# Patient Record
Sex: Female | Born: 1960 | Race: White | Hispanic: No | Marital: Married | Smoking: Current every day smoker
Health system: Southern US, Community
[De-identification: ages and names within clinical notes are randomized; demographics above are authoritative.]

## PROBLEM LIST (undated history)

## (undated) DIAGNOSIS — F32A Depression, unspecified: Secondary | ICD-10-CM

## (undated) DIAGNOSIS — F419 Anxiety disorder, unspecified: Secondary | ICD-10-CM

## (undated) HISTORY — DX: Depression, unspecified: F32.A

---

## 2002-03-21 ENCOUNTER — Encounter: Payer: Self-pay | Admitting: Family Medicine

## 2002-03-21 ENCOUNTER — Ambulatory Visit (HOSPITAL_COMMUNITY): Admission: RE | Admit: 2002-03-21 | Discharge: 2002-03-21 | Payer: Self-pay | Admitting: Nurse Practitioner

## 2002-04-06 ENCOUNTER — Encounter: Payer: Self-pay | Admitting: Family Medicine

## 2002-04-06 ENCOUNTER — Ambulatory Visit (HOSPITAL_COMMUNITY): Admission: RE | Admit: 2002-04-06 | Discharge: 2002-04-06 | Payer: Self-pay | Admitting: Family Medicine

## 2003-07-25 ENCOUNTER — Ambulatory Visit (HOSPITAL_COMMUNITY): Admission: RE | Admit: 2003-07-25 | Discharge: 2003-07-25 | Payer: Self-pay | Admitting: Pulmonary Disease

## 2004-08-07 ENCOUNTER — Ambulatory Visit (HOSPITAL_COMMUNITY): Admission: RE | Admit: 2004-08-07 | Discharge: 2004-08-07 | Payer: Self-pay | Admitting: Pulmonary Disease

## 2005-08-11 ENCOUNTER — Ambulatory Visit (HOSPITAL_COMMUNITY): Admission: RE | Admit: 2005-08-11 | Discharge: 2005-08-11 | Payer: Self-pay | Admitting: Pulmonary Disease

## 2006-10-18 ENCOUNTER — Ambulatory Visit (HOSPITAL_COMMUNITY): Admission: RE | Admit: 2006-10-18 | Discharge: 2006-10-18 | Payer: Self-pay | Admitting: Obstetrics and Gynecology

## 2007-01-31 ENCOUNTER — Ambulatory Visit: Payer: Self-pay | Admitting: Gastroenterology

## 2007-02-18 ENCOUNTER — Ambulatory Visit (HOSPITAL_COMMUNITY): Admission: RE | Admit: 2007-02-18 | Discharge: 2007-02-18 | Payer: Self-pay | Admitting: Family Medicine

## 2007-05-10 ENCOUNTER — Ambulatory Visit: Payer: Self-pay | Admitting: Internal Medicine

## 2007-05-23 ENCOUNTER — Ambulatory Visit (HOSPITAL_COMMUNITY): Admission: RE | Admit: 2007-05-23 | Discharge: 2007-05-23 | Payer: Self-pay | Admitting: Internal Medicine

## 2007-05-23 ENCOUNTER — Ambulatory Visit: Payer: Self-pay | Admitting: Internal Medicine

## 2007-08-22 ENCOUNTER — Ambulatory Visit (HOSPITAL_COMMUNITY): Admission: RE | Admit: 2007-08-22 | Discharge: 2007-08-22 | Payer: Self-pay | Admitting: Internal Medicine

## 2007-08-25 ENCOUNTER — Ambulatory Visit: Payer: Self-pay | Admitting: Internal Medicine

## 2007-10-20 ENCOUNTER — Ambulatory Visit (HOSPITAL_COMMUNITY): Admission: RE | Admit: 2007-10-20 | Discharge: 2007-10-20 | Payer: Self-pay | Admitting: Obstetrics and Gynecology

## 2008-02-20 ENCOUNTER — Ambulatory Visit (HOSPITAL_COMMUNITY): Admission: RE | Admit: 2008-02-20 | Discharge: 2008-02-20 | Payer: Self-pay | Admitting: Internal Medicine

## 2008-09-01 ENCOUNTER — Emergency Department (HOSPITAL_COMMUNITY): Admission: EM | Admit: 2008-09-01 | Discharge: 2008-09-01 | Payer: Self-pay | Admitting: Emergency Medicine

## 2008-09-05 ENCOUNTER — Ambulatory Visit (HOSPITAL_COMMUNITY): Admission: RE | Admit: 2008-09-05 | Discharge: 2008-09-05 | Payer: Self-pay | Admitting: Internal Medicine

## 2008-12-31 ENCOUNTER — Ambulatory Visit (HOSPITAL_COMMUNITY): Admission: RE | Admit: 2008-12-31 | Discharge: 2008-12-31 | Payer: Self-pay | Admitting: Obstetrics and Gynecology

## 2009-01-03 ENCOUNTER — Ambulatory Visit (HOSPITAL_COMMUNITY): Admission: RE | Admit: 2009-01-03 | Discharge: 2009-01-03 | Payer: Self-pay | Admitting: Obstetrics and Gynecology

## 2009-01-20 ENCOUNTER — Emergency Department (HOSPITAL_COMMUNITY): Admission: EM | Admit: 2009-01-20 | Discharge: 2009-01-20 | Payer: Self-pay | Admitting: Emergency Medicine

## 2009-08-23 ENCOUNTER — Other Ambulatory Visit: Admission: RE | Admit: 2009-08-23 | Discharge: 2009-08-23 | Payer: Self-pay | Admitting: Obstetrics & Gynecology

## 2010-01-14 ENCOUNTER — Ambulatory Visit (HOSPITAL_COMMUNITY): Admission: RE | Admit: 2010-01-14 | Discharge: 2010-01-14 | Payer: Self-pay | Admitting: Obstetrics and Gynecology

## 2010-08-26 ENCOUNTER — Other Ambulatory Visit: Admission: RE | Admit: 2010-08-26 | Discharge: 2010-08-26 | Payer: Self-pay | Admitting: Obstetrics & Gynecology

## 2010-12-28 ENCOUNTER — Encounter: Payer: Self-pay | Admitting: Obstetrics and Gynecology

## 2010-12-28 ENCOUNTER — Encounter: Payer: Self-pay | Admitting: Family Medicine

## 2010-12-29 ENCOUNTER — Encounter: Payer: Self-pay | Admitting: Internal Medicine

## 2011-04-21 NOTE — Assessment & Plan Note (Signed)
Vickie [name], Vickie [name]               CHART#:  [id number]   DATE:  08/25/2007                       DOB:  Sep 10, 1961   SUBJECTIVE:  The patient is a 50 year old Caucasian female with  refractory GERD. She had previously been on omeprazole, was recently  changed to Prevacid 30 mg daily. She has been doing much better on this  medication. She had an EGD by Dr. Jena Gauss on 05/22/07. She was found to  have a normal exam.  She also has had an MRI of the liver on 03/01/07.  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 She had normal LFTs. She had a followup MRI on 08/22/07. The  lesion measured 1.4 x 1.3 x 1.2 cm and was stable. Again felt to be FNH  but 32-month followup was recommended. Overall, the patient is doing very  well. Her weight is steadily decreasing.   CURRENT MEDICATIONS:  See the list from 08/25/07.   ALLERGIES:  1. PENICILLIN.  2. CODEINE.   OBJECTIVE:  VITAL SIGNS: Weight 168 pounds, height 62.5 inches, temp 94.  Blood pressure 100/70 and pulse 60.  GENERAL: The patient is a well-developed, well-nourished Caucasian  female in no acute distress.  HEENT: Sclerae clear, anicteric.  Oropharynx pink and moist without any  lesions.  CHEST: Regular rate and rhythm, normal S1, S2.  ABDOMEN: Positive bowel sounds x4. No bruits auscultated. Soft,  nontender, nondistended, without palpable mass or hepatosplenomegaly. No  tenderness or guarding.  EXTREMITIES: Without clubbing or edema bilaterally.  SKIN: Warm, dry without rash or jaundice.   ASSESSMENT:  1. The patient is a 50 year old female with gastroesophageal reflux      disease, symptoms well controlled on Prevacid 30 mg daily.  2. She also hepatic focal nodular hyperplasia. Will need 71-month      followup to confirm stability.   PLAN:  1. Prevacid 30 mg daily #31 with 11 refills. We will attempt to give      her patient assistance forms for this.  2. MRI of the liver 02/19/08.  3.  Office visit a couple of days after MRI to discuss findings.       Lorenza Burton, N.P.  Electronically Signed     R. Roetta Sessions, M.D.  Electronically Signed    KJ/MEDQ  D:  08/25/2007  T:  08/25/2007  Job:  [id number]   cc:   Corrie Mckusick, M.D.

## 2011-04-21 NOTE — H&P (Signed)
NAME:  Vickie [name], Vickie [name]              ACCOUNT NO.:  [id number]   MEDICAL RECORD NO.:  [id number]          PATIENT TYPE:  AMB   LOCATION:  DAY                           FACILITY:  [facility]   PHYSICIAN:  R. Roetta Sessions, M.D. DATE OF BIRTH:  [birth year]   DATE OF ADMISSION:  DATE OF DISCHARGE:  LH                              HISTORY & PHYSICAL   CHIEF COMPLAINT:  Refractory reflux symptoms.   The patient is a pleasant obese 50 year old Caucasian female originally  seen by our group back in February by Dr. Cira Servant for progressive reflux  symptoms in the setting of a 30-pound weight gain. She had stopped  smoking earlier this year. She had been on Prilosec 20 mg twice daily  and more recently Prevacid 30 mg once daily, samples with significant  better control with Prevacid although she sometimes has almost daily  symptoms has taken a number of over-the-counter agents. No odynophagia,  and no dysphagia.  She is a former long-term smoker. She does not use  any alcohol.   Interestingly, a crown came loose in her mouth and she swallowed it back  in March.  This led to radiographic examination orchestrated by Dr.  Phillips Odor. She ultimately ended up getting a CT and MRI. CT reportedly  demonstrated gallstones and nonspecific lesion in her liver.  She also  had some ovarian cysts.  This led to an open MRI scan down in [place]  which revealed a small central right hepatic lesion approximately 15 mm  size. It was nonspecific and could not be called a simple hemangioma.  It was recommended 3-6 month interim follow-up CT.  I do not have any  information regarding the patient's LFTs.   She did take birth control pills for 5 years several years ago. She has  not had any abdominal pain.  She has one bowel movement day.  No melena,  rectal pain, constipation, diarrhea.   PAST MEDICAL HISTORY:  Significant for gastroesophageal reflux disease.   CURRENT MEDICATIONS:  Either Prilosec OTC b.i.d. or Prevacid  30 grams  orally daily.   ALLERGIES:  PENICILLIN, CODEINE.   She has never had an EGD.   FAMILY HISTORY:  Mother and father both had gallbladders removed.  No  history of gastrointestinal cancer.   SOCIAL HISTORY:  The patient has two children, one 20 and one 70.  She  lives with her significant other. She was a former Neurosurgeon. No  illicit drug use.  No alcohol use.  Former smoker.   REVIEW OF SYSTEMS:  No chest pain, dyspnea on exertion.  No fever,  chills.  She is gained 30 pounds in the past year.   PHYSICAL EXAMINATION:  GENERAL:  A pleasant 50 year old lady resting  comfortably.  VITAL SIGNS:  Weight 172, height 5 feet 4 inches, temperature 97.8, BP  120/80, pulse 60.  SKIN:  Warm and dry.  No jaundice or a stigmata of chronic liver  disease.  HEENT:  No scleral icterus.  Conjunctivae are pink.  Oral cavity no  lesions.  JVD is not prominent.  CHEST:  Lungs clear to auscultation.  HEART:  Regular rate and rhythm without murmur, gallop or rub.  BREAST:  Deferred.  ABDOMEN:  Obese, positive bowel sounds, soft, nontender without  appreciable mass or organomegaly.  EXTREMITIES:  Trace lower extremity edema.   IMPRESSION:  The patient is a pleasant 46-year lady with significant  weight gain who now has somewhat refractory gastroesophageal reflux  disease symptoms.  I suspect weight gain is predisposing her reflux.  She is really poorly controlled on standard dose PPI therapy.  This  warrants a look at her upper GI tract directly via EGD. I have  recommended this procedure. The potential risks, benefits and  alternatives have been reviewed.  Questions were answered.  She is  agreeable.  We will plan for an EGD in the very near future at [facility]  We will give her usual samples of Prevacid 30 grams  orally daily x30 days.   She has a nonspecific a central lesion on MRI the right lobe of liver on  the MRI  which was recently seen on CT. As stated, this appears  to be  entirely nonspecific lesion and I would agree with the radiologist that  interim follow-up MRI was warranted.  We will plan to get this study  repeated the latter part of July, we will check a hepatic profile make  sure everything looks good.   Reported gallstones seen on CT asymptomatic.   Further recommendations to follow in the very near future.      Jonathon Bellows, M.D.  Electronically Signed     RMR/MEDQ  D:  05/10/2007  T:  05/10/2007  Job:  [id number]   cc:   Corrie Mckusick, M.D.  Fax: [phone]

## 2011-04-21 NOTE — Op Note (Signed)
NAME:  [name], Vickie              ACCOUNT NO.:  [id number]   MEDICAL RECORD NO.:  [id number]          PATIENT TYPE:  AMB   LOCATION:  DAY                           FACILITY:  [facility]   PHYSICIAN:  R. Roetta Sessions, M.D. DATE OF BIRTH:  December 18, 1960   DATE OF PROCEDURE:  05/23/2007  DATE OF DISCHARGE:                                PROCEDURE NOTE   INDICATIONS FOR PROCEDURE:  A 50 year old lady with significant weight  gain, somewhat refractory gastroesophageal reflux disease symptoms.  She  does much better with Prevacid than omeprazole, which she has been  taking more recently.  Has not had any GI bleeding, odynophagia,  dysphagia, early satiety or weight loss.  EGD is now being done.  This  approach has been discussed with the patient in length.  Potential  risks, benefits and alternatives have been reviewed, questions answered,  she is agreeable.  __________.   O2 saturation, blood pressure, pulse, respirations were monitored  throughout the entire procedure.   CONSCIOUS SEDATION:  Versed 4 mg IV, Demerol 75 mg IV in divided doses.  Cetacaine spray for topical oropharyngeal anesthesia.   FINDINGS:  Examination of the tubular esophagus revealed no mucosal  abnormalities.  __________  gastric cavity was empty, insufflated well  with air.  A thorough examination of gastric mucosa with retroflexion of  the proximal stomach and esophagogastric junction demonstrated no  abnormalities.  Pylorus patent, easily traversed.  Summation of the  bulbous second portion revealed no abnormalities.   THERAPEUTIC/DIAGNOSTIC MANEUVERS PERFORMED:  None.   The patient tolerated the procedure well __________.   IMPRESSION:  Normal esophagus and stomach, D1, D2.   RECOMMENDATIONS:  Continue Prevacid 30 mg orally daily as this seemed to  work well for control of reflux symptoms.  I have advocated weight loss,  antireflux lifestyle/diet, literature on gastroesophageal reflux disease  given to Ms.  [name].  Followup appointment with [place] in 3 months.  As a  totally separate issue, schedule repeat MRI of the liver in July to  follow up a previously-noted nonspecific liver lesion.      Vickie [name], M.D.  Electronically Signed     RMR/MEDQ  D:  05/23/2007  T:  05/23/2007  Job:  [id number]   cc:   Corrie Mckusick, M.D.  Fax: [phone]

## 2011-04-27 ENCOUNTER — Other Ambulatory Visit: Payer: Self-pay | Admitting: Obstetrics and Gynecology

## 2011-04-27 ENCOUNTER — Other Ambulatory Visit: Payer: Self-pay | Admitting: Obstetrics & Gynecology

## 2011-04-27 DIAGNOSIS — Z139 Encounter for screening, unspecified: Secondary | ICD-10-CM

## 2011-05-07 ENCOUNTER — Ambulatory Visit (HOSPITAL_COMMUNITY)
Admission: RE | Admit: 2011-05-07 | Discharge: 2011-05-07 | Disposition: A | Discharge status: Home / Self Care | Payer: Self-pay | Source: Ambulatory Visit | Attending: Obstetrics & Gynecology | Admitting: Obstetrics & Gynecology

## 2011-05-07 DIAGNOSIS — Z139 Encounter for screening, unspecified: Secondary | ICD-10-CM

## 2011-08-11 ENCOUNTER — Emergency Department (HOSPITAL_COMMUNITY)
Admission: EM | Admit: 2011-08-11 | Discharge: 2011-08-11 | Disposition: A | Discharge status: Home / Self Care | Payer: Self-pay | Attending: Emergency Medicine | Admitting: Emergency Medicine

## 2011-08-11 ENCOUNTER — Encounter: Payer: Self-pay | Admitting: *Deleted

## 2011-08-11 DIAGNOSIS — H81399 Other peripheral vertigo, unspecified ear: Secondary | ICD-10-CM | POA: Insufficient documentation

## 2011-08-11 DIAGNOSIS — H8110 Benign paroxysmal vertigo, unspecified ear: Secondary | ICD-10-CM

## 2011-08-11 LAB — URINALYSIS, ROUTINE W REFLEX MICROSCOPIC
Bilirubin Urine: NEGATIVE
Glucose, UA: NEGATIVE mg/dL
Hgb urine dipstick: NEGATIVE
Ketones, ur: NEGATIVE mg/dL
Leukocytes, UA: NEGATIVE
Nitrite: NEGATIVE
Protein, ur: NEGATIVE mg/dL
Specific Gravity, Urine: 1.015 (ref 1.005–1.030)
Urobilinogen, UA: 0.2 mg/dL (ref 0.0–1.0)
pH: 6 (ref 5.0–8.0)

## 2011-08-11 LAB — POCT PREGNANCY, URINE: Preg Test, Ur: NEGATIVE

## 2011-08-11 MED ORDER — MECLIZINE HCL 50 MG PO TABS: 25.0000 mg | ORAL_TABLET | Freq: Three times a day (TID) | ORAL | Status: AC | PRN

## 2011-08-11 MED ORDER — MECLIZINE HCL 12.5 MG PO TABS
12.5000 mg | ORAL_TABLET | Freq: Once | ORAL | Status: AC
  Administered 2011-08-11: 12.5 mg via ORAL
  Filled 2011-08-11: qty 1

## 2011-08-11 NOTE — ED Provider Notes (Signed)
History   50yF with vertigo. Onset yesterday. Intermittent. Describes sensation that room is spinning. Positional. Can reproduce when laying down and looking to R side. Better when sitting up. Denies trauma. No fever or chills. No sensation of ear fullness, no tinnitus and no ear pain. Denies hx of ototoxic med use. No CP, palpitations or SOB.Can ambulate without difficulty. No hx of similar symptoms. Nausea when symptomatic. NO change in visual acuity.  CSN: [id number] Arrival date & time: 08/11/2011  7:06 PM  Chief Complaint  Patient presents with  . Nausea   HPI  History reviewed. No pertinent past medical history.  History reviewed. No pertinent past surgical history.  History reviewed. No pertinent family history.  History  Substance Use Topics  . Smoking status: Not on file  . Smokeless tobacco: Not on file  . Alcohol Use: No    OB History    Grav Para Term Preterm Abortions TAB SAB Ect Mult Living                  Review of Systems  Constitutional: Negative.   HENT: Negative for hearing loss, ear pain, neck pain, neck stiffness, tinnitus and ear discharge.   Eyes: Negative.   Respiratory: Negative.   Cardiovascular: Negative.   Gastrointestinal: Negative.   Neurological: Negative for syncope and headaches.  Psychiatric/Behavioral: Negative.   All other systems reviewed and are negative.    Physical Exam  BP 124/76  Pulse 61  Temp(Src) 98.4 F (36.9 C) (Oral)  Resp 17  Ht 5\' 2"  (1.575 m)  Wt 162 lb 9.6 oz (73.755 kg)  BMI 29.74 kg/m2  SpO2 98%  Physical Exam  Nursing note and vitals reviewed. Constitutional: She is oriented to person, place, and time.  HENT:  Head: Normocephalic.  Right Ear: External ear normal.  Left Ear: External ear normal.       Pt severely symptomatic with dix hallpike maneuver when head turn to her right but could not appreciate nystagmus. Improved when sitting back up. 5 second latent period after laying down. Less symptomatic  with repeat testing. TMs clear b/l. Could not reproduce symptoms with pressure on tragus,  Eyes: Conjunctivae and EOM are normal. Pupils are equal, round, and reactive to light.  Neck: Normal range of motion. Neck supple.  Cardiovascular: Normal rate and regular rhythm.   Pulmonary/Chest: Effort normal and breath sounds normal.  Neurological: She is alert and oriented to person, place, and time. No cranial nerve deficit. Coordination normal.       Gait steady. No truncal ataxia noted with particular attention made with position changes. Good finger-to-nose bilaterally. No problems with rapid alternating finger movements.  Skin: Skin is warm and dry.  Psychiatric: She has a normal mood and affect.    ED Course  Procedures  MDM Pt with very good story for peripheral vertigo. Suspect BPPV. Positional. Fatigable. Latency of ~5 seconds with dix-hallpike. No ear pain, sensation of fullness or tinnitus. Timing does not consistent with meniere's or labrynthitis eaither. No ototoxic drugs such as aminoglycosides or lasix. No fever. No recent or distant hx of trauma or reproducibilty of symptoms with pressure on tragus to suggest perilymphatic fistula. Neuro exam nonfocal. Pt denies HA as mentioned in triage note. Pt states that she feels like head is "swimming" with episodes but not actually having pain. Consider central causes such as vbi, cerebellar stroke, MS, etc as well, but very low suspicion and do not feel further w/u indicated at this time. Epley maneuver  preformed in ED x1 with instructions given to pt on how to do at home. ENT referral for persistent symptoms. Meclizine for symptomatic relief.      Raeford Razor, MD 08/15/11 2025

## 2011-08-11 NOTE — ED Notes (Signed)
Pt self ambulated out with a steady gait stating no needs 

## 2011-08-11 NOTE — ED Notes (Signed)
C/o dizziness and nausea onset yesterday morning

## 2012-03-31 ENCOUNTER — Other Ambulatory Visit: Payer: Self-pay | Admitting: Obstetrics & Gynecology

## 2012-03-31 DIAGNOSIS — Z139 Encounter for screening, unspecified: Secondary | ICD-10-CM

## 2012-05-09 ENCOUNTER — Ambulatory Visit (HOSPITAL_COMMUNITY): Payer: Self-pay

## 2012-10-20 ENCOUNTER — Other Ambulatory Visit (HOSPITAL_COMMUNITY): Payer: Self-pay | Admitting: Nurse Practitioner

## 2012-10-20 DIAGNOSIS — Z139 Encounter for screening, unspecified: Secondary | ICD-10-CM

## 2012-10-28 ENCOUNTER — Ambulatory Visit (HOSPITAL_COMMUNITY)
Admission: RE | Admit: 2012-10-28 | Discharge: 2012-10-28 | Disposition: A | Discharge status: Home / Self Care | Payer: PRIVATE HEALTH INSURANCE | Source: Ambulatory Visit | Attending: Nurse Practitioner | Admitting: Nurse Practitioner

## 2012-10-28 DIAGNOSIS — Z139 Encounter for screening, unspecified: Secondary | ICD-10-CM

## 2012-10-28 DIAGNOSIS — Z1231 Encounter for screening mammogram for malignant neoplasm of breast: Secondary | ICD-10-CM | POA: Insufficient documentation

## 2013-03-19 ENCOUNTER — Emergency Department (HOSPITAL_COMMUNITY): Payer: Self-pay

## 2013-03-19 ENCOUNTER — Emergency Department (HOSPITAL_COMMUNITY)
Admission: EM | Admit: 2013-03-19 | Discharge: 2013-03-20 | Disposition: A | Discharge status: Home / Self Care | Payer: Self-pay | Attending: Emergency Medicine | Admitting: Emergency Medicine

## 2013-03-19 ENCOUNTER — Encounter (HOSPITAL_COMMUNITY): Payer: Self-pay | Admitting: Emergency Medicine

## 2013-03-19 DIAGNOSIS — Z79899 Other long term (current) drug therapy: Secondary | ICD-10-CM | POA: Insufficient documentation

## 2013-03-19 DIAGNOSIS — Y9301 Activity, walking, marching and hiking: Secondary | ICD-10-CM | POA: Insufficient documentation

## 2013-03-19 DIAGNOSIS — S93402A Sprain of unspecified ligament of left ankle, initial encounter: Secondary | ICD-10-CM

## 2013-03-19 DIAGNOSIS — S92252A Displaced fracture of navicular [scaphoid] of left foot, initial encounter for closed fracture: Secondary | ICD-10-CM

## 2013-03-19 DIAGNOSIS — Y9289 Other specified places as the place of occurrence of the external cause: Secondary | ICD-10-CM | POA: Insufficient documentation

## 2013-03-19 DIAGNOSIS — S92253A Displaced fracture of navicular [scaphoid] of unspecified foot, initial encounter for closed fracture: Secondary | ICD-10-CM | POA: Insufficient documentation

## 2013-03-19 DIAGNOSIS — X500XXA Overexertion from strenuous movement or load, initial encounter: Secondary | ICD-10-CM | POA: Insufficient documentation

## 2013-03-19 DIAGNOSIS — Z880 Allergy status to penicillin: Secondary | ICD-10-CM | POA: Insufficient documentation

## 2013-03-19 DIAGNOSIS — F17200 Nicotine dependence, unspecified, uncomplicated: Secondary | ICD-10-CM | POA: Insufficient documentation

## 2013-03-19 DIAGNOSIS — S93409A Sprain of unspecified ligament of unspecified ankle, initial encounter: Secondary | ICD-10-CM | POA: Insufficient documentation

## 2013-03-19 NOTE — ED Notes (Signed)
Patient states she was walking down her basement steps and missed the last step and twisted her left ankle.  Left ankle with swelling and bruising.  Patient states has been using ice with no relief of swelling.

## 2013-03-19 NOTE — ED Provider Notes (Signed)
History  This chart was scribed for Sudie Grumbling, MD by Greggory Stallion, ED Scribe. This patient was seen in room [facility] and the patient's care was started at 11:26 PM.  CSN: [id number]  Arrival date & time 03/19/13  2300   First MD Initiated Contact with Patient 03/19/13 2315      Chief Complaint  Patient presents with  . Ankle Injury     The history is provided by the patient. No language interpreter was used.    Vickie [name] is a 52 y.o. female who presents to the Emergency Department complaining of sudden onset, gradually worsening, constant left ankle pain that started yesterday. She states she missed a step and inverted her ankle. She reports that she can't bend her toes but has been ambulating with a walker since. She reports that she has been using ice with some relief. She reports prior injuries to the same ankle, none requiring surgery. She denies any other injuries. She does not have a h/o chronic medical conditions. She is a current everyday smoker and occasional alcohol user.  History reviewed. No pertinent past medical history.  History reviewed. No pertinent past surgical history.  No family history on file.  History  Substance Use Topics  . Smoking status: Current Every Day Smoker  . Smokeless tobacco: Not on file  . Alcohol Use: Yes    No OB history provided.  Review of Systems  A complete 10 system review of systems was obtained and all systems are negative except as noted in the HPI and PMH.   Allergies  Codeine and Penicillins  Home Medications   Current Outpatient Rx  Name  Route  Sig  Dispense  Refill  . ALPRAZolam (XANAX) 0.5 MG tablet   Oral   Take 0.5 mg by mouth at bedtime as needed.             Triage Vitals: BP 112/61  Pulse 67  Temp(Src) 97.9 F (36.6 C) (Oral)  Resp 18  Ht 5' 1.25" (1.556 m)  Wt 131 lb (59.421 kg)  BMI 24.54 kg/m2  SpO2 98%  Physical Exam  Nursing note and vitals reviewed. Constitutional: She is  oriented to person, place, and time. She appears well-developed and well-nourished. No distress.  HENT:  Head: Normocephalic and atraumatic.  Eyes: EOM are normal.  Neck: Neck supple. No tracheal deviation present.  Cardiovascular: Normal rate.   Pulmonary/Chest: Effort normal. No respiratory distress.  Musculoskeletal: Normal range of motion.  Swelling over lateral malleolus, extending into the mid-foot  Neurological: She is alert and oriented to person, place, and time.  Skin: Skin is warm and dry.  Psychiatric: She has a normal mood and affect. Her behavior is normal.    ED Course  Procedures (including critical care time)  DIAGNOSTIC STUDIES: Oxygen Saturation is 98% on RA, normal by my interpretation.    COORDINATION OF CARE: 11:28 PM-Discussed treatment plan which includes foot DG and ankle DG with pt at bedside and pt agreed to plan.    Labs Reviewed - No data to display No results found.   No diagnosis found.    MDM  The left ankle has significant swelling.  The xrays are negative with the exception of a small avulsion fracture of the navicular bone of the foot.  Will treat with ice, rest, elevate, crutches.  Follow up prn if not improving.      I personally performed the services described in this documentation, which was scribed in my  presence. The recorded information has been reviewed and is accurate.     Sudie Grumbling, MD 03/20/13 [phone]

## 2014-06-13 ENCOUNTER — Other Ambulatory Visit: Payer: Self-pay | Admitting: Obstetrics & Gynecology

## 2014-06-13 DIAGNOSIS — Z1231 Encounter for screening mammogram for malignant neoplasm of breast: Secondary | ICD-10-CM

## 2014-06-14 ENCOUNTER — Ambulatory Visit (HOSPITAL_COMMUNITY)
Admission: RE | Admit: 2014-06-14 | Discharge: 2014-06-14 | Disposition: A | Discharge status: Home / Self Care | Payer: Self-pay | Source: Ambulatory Visit | Attending: Obstetrics & Gynecology | Admitting: Obstetrics & Gynecology

## 2014-06-14 DIAGNOSIS — Z1231 Encounter for screening mammogram for malignant neoplasm of breast: Secondary | ICD-10-CM

## 2014-06-21 ENCOUNTER — Ambulatory Visit (INDEPENDENT_AMBULATORY_CARE_PROVIDER_SITE_OTHER): Payer: Self-pay | Admitting: Obstetrics & Gynecology

## 2014-06-21 ENCOUNTER — Encounter: Payer: Self-pay | Admitting: Obstetrics & Gynecology

## 2014-06-21 ENCOUNTER — Other Ambulatory Visit (HOSPITAL_COMMUNITY)
Admission: RE | Admit: 2014-06-21 | Discharge: 2014-06-21 | Disposition: A | Discharge status: Home / Self Care | Payer: Self-pay | Source: Ambulatory Visit | Attending: Obstetrics & Gynecology | Admitting: Obstetrics & Gynecology

## 2014-06-21 VITALS — BP 110/70 | Ht 62.0 in | Wt 128.0 lb

## 2014-06-21 DIAGNOSIS — Z1212 Encounter for screening for malignant neoplasm of rectum: Secondary | ICD-10-CM

## 2014-06-21 DIAGNOSIS — Z01419 Encounter for gynecological examination (general) (routine) without abnormal findings: Secondary | ICD-10-CM | POA: Insufficient documentation

## 2014-06-21 DIAGNOSIS — Z1151 Encounter for screening for human papillomavirus (HPV): Secondary | ICD-10-CM | POA: Insufficient documentation

## 2014-06-21 MED ORDER — TERCONAZOLE 0.4 % VA CREA: 1.0000 | TOPICAL_CREAM | Freq: Every day | VAGINAL | Status: DC

## 2014-06-21 MED ORDER — MIRABEGRON ER 50 MG PO TB24: 50.0000 mg | ORAL_TABLET | Freq: Every day | ORAL | Status: DC

## 2014-06-21 NOTE — Progress Notes (Signed)
Patient ID: Vickie [name], female   DOB: 12/08/1960, 53 y.o.   MRN: [id number] Subjective:     Vickie [name] is a 53 y.o. female here for a routine exam.  No LMP recorded. Patient is postmenopausal. No obstetric history on file. Birth Control Method:  Post menopausal Menstrual Calendar(currently): post menopausal  Current complaints: none.   Current acute medical issues:  none   Recent Gynecologic History No LMP recorded. Patient is postmenopausal. Last Pap: 2014,  normal Last mammogram: 06/2014,  normal  History reviewed. No pertinent past medical history.  History reviewed. No pertinent past surgical history.  OB History   Grav Para Term Preterm Abortions TAB SAB Ect Mult Living                  History   Social History  . Marital Status: Married    Spouse Name: N/A    Number of Children: N/A  . Years of Education: N/A   Social History Main Topics  . Smoking status: Current Every Day Smoker  . Smokeless tobacco: None  . Alcohol Use: Yes  . Drug Use: No  . Sexual Activity: None   Other Topics Concern  . None   Social History Narrative  . None    Family History  Problem Relation Age of Onset  . Heart disease Father      Review of Systems  Review of Systems  Constitutional: Negative for fever, chills, weight loss, malaise/fatigue and diaphoresis.  HENT: Negative for hearing loss, ear pain, nosebleeds, congestion, sore throat, neck pain, tinnitus and ear discharge.   Eyes: Negative for blurred vision, double vision, photophobia, pain, discharge and redness.  Respiratory: Negative for cough, hemoptysis, sputum production, shortness of breath, wheezing and stridor.   Cardiovascular: Negative for chest pain, palpitations, orthopnea, claudication, leg swelling and PND.  Gastrointestinal: negative for abdominal pain. Negative for heartburn, nausea, vomiting, diarrhea, constipation, blood in stool and melena.  Genitourinary: Negative for dysuria, urgency, frequency,  hematuria and flank pain.  Musculoskeletal: Negative for myalgias, back pain, joint pain and falls.  Skin: Negative for itching and rash.  Neurological: Negative for dizziness, tingling, tremors, sensory change, speech change, focal weakness, seizures, loss of consciousness, weakness and headaches.  Endo/Heme/Allergies: Negative for environmental allergies and polydipsia. Does not bruise/bleed easily.  Psychiatric/Behavioral: Negative for depression, suicidal ideas, hallucinations, memory loss and substance abuse. The patient is not nervous/anxious and does not have insomnia.        Objective:    Physical Exam  Vitals reviewed. Constitutional: She is oriented to person, place, and time. She appears well-developed and well-nourished.  HENT:  Head: Normocephalic and atraumatic.        Right Ear: External ear normal.  Left Ear: External ear normal.  Nose: Nose normal.  Mouth/Throat: Oropharynx is clear and moist.  Eyes: Conjunctivae and EOM are normal. Pupils are equal, round, and reactive to light. Right eye exhibits no discharge. Left eye exhibits no discharge. No scleral icterus.  Neck: Normal range of motion. Neck supple. No tracheal deviation present. No thyromegaly present.  Cardiovascular: Normal rate, regular rhythm, normal heart sounds and intact distal pulses.  Exam reveals no gallop and no friction rub.   No murmur heard. Respiratory: Effort normal and breath sounds normal. No respiratory distress. She has no wheezes. She has no rales. She exhibits no tenderness.  GI: Soft. Bowel sounds are normal. She exhibits no distension and no mass. There is no tenderness. There is no rebound and no  guarding.  Genitourinary:  Breasts no masses skin changes or nipple changes bilaterally      Vulva is normal without lesions Vagina is pink moist without discharge Cervix normal in appearance and pap is done Uterus is normal size shape and contour Adnexa is negative with normal sized ovaries   Rectal    hemoccult negative, normal tone, no masses  Musculoskeletal: Normal range of motion. She exhibits no edema and no tenderness.  Neurological: She is alert and oriented to person, place, and time. She has normal reflexes. She displays normal reflexes. No cranial nerve deficit. She exhibits normal muscle tone. Coordination normal.  Skin: Skin is warm and dry. No rash noted. No erythema. No pallor.  Psychiatric: She has a normal mood and affect. Her behavior is normal. Judgment and thought content normal.       Assessment:    Healthy female exam.    Plan:    Follow up in: 1 year.   Smoking cessation discussed

## 2014-06-25 LAB — CYTOLOGY - PAP

## 2014-07-02 ENCOUNTER — Telehealth: Payer: Self-pay | Admitting: Obstetrics & Gynecology

## 2014-07-02 NOTE — Telephone Encounter (Signed)
Spoke with pt letting her know pap was normal. JSY 

## 2014-08-22 ENCOUNTER — Ambulatory Visit: Payer: Self-pay | Admitting: Obstetrics & Gynecology

## 2015-05-15 ENCOUNTER — Other Ambulatory Visit: Payer: Self-pay | Admitting: Obstetrics & Gynecology

## 2015-05-15 DIAGNOSIS — Z1231 Encounter for screening mammogram for malignant neoplasm of breast: Secondary | ICD-10-CM

## 2015-06-17 ENCOUNTER — Ambulatory Visit (HOSPITAL_COMMUNITY)
Admission: RE | Admit: 2015-06-17 | Discharge: 2015-06-17 | Disposition: A | Discharge status: Home / Self Care | Payer: Self-pay | Source: Ambulatory Visit | Attending: Obstetrics & Gynecology | Admitting: Obstetrics & Gynecology

## 2015-06-17 DIAGNOSIS — Z1231 Encounter for screening mammogram for malignant neoplasm of breast: Secondary | ICD-10-CM | POA: Insufficient documentation

## 2015-07-04 ENCOUNTER — Telehealth: Payer: Self-pay | Admitting: *Deleted

## 2015-07-09 NOTE — Telephone Encounter (Signed)
[email or web address]

## 2015-07-17 ENCOUNTER — Telehealth: Payer: Self-pay | Admitting: Obstetrics & Gynecology

## 2015-07-17 NOTE — Telephone Encounter (Signed)
Pt informed of results of mammogram from 06/17/2015 no evidence of malignancy, repeat in 1 year. Pt verbalized understanding.

## 2017-04-08 ENCOUNTER — Encounter: Payer: Self-pay | Admitting: *Deleted

## 2017-04-08 ENCOUNTER — Other Ambulatory Visit: Payer: Self-pay | Admitting: Obstetrics & Gynecology

## 2017-07-08 ENCOUNTER — Other Ambulatory Visit: Payer: Self-pay | Admitting: Obstetrics & Gynecology

## 2017-08-16 ENCOUNTER — Encounter: Payer: Self-pay | Admitting: Obstetrics & Gynecology

## 2017-08-16 ENCOUNTER — Other Ambulatory Visit (HOSPITAL_COMMUNITY)
Admission: RE | Admit: 2017-08-16 | Discharge: 2017-08-16 | Disposition: A | Discharge status: Home / Self Care | Payer: Self-pay | Source: Ambulatory Visit | Attending: Obstetrics & Gynecology | Admitting: Obstetrics & Gynecology

## 2017-08-16 ENCOUNTER — Encounter (INDEPENDENT_AMBULATORY_CARE_PROVIDER_SITE_OTHER): Payer: Self-pay

## 2017-08-16 ENCOUNTER — Ambulatory Visit (INDEPENDENT_AMBULATORY_CARE_PROVIDER_SITE_OTHER): Payer: Self-pay | Admitting: Obstetrics & Gynecology

## 2017-08-16 VITALS — BP 118/62 | HR 56 | Ht 61.0 in | Wt 129.0 lb

## 2017-08-16 DIAGNOSIS — Z1211 Encounter for screening for malignant neoplasm of colon: Secondary | ICD-10-CM

## 2017-08-16 DIAGNOSIS — Z1239 Encounter for other screening for malignant neoplasm of breast: Secondary | ICD-10-CM

## 2017-08-16 DIAGNOSIS — Z01419 Encounter for gynecological examination (general) (routine) without abnormal findings: Secondary | ICD-10-CM

## 2017-08-16 DIAGNOSIS — Z1212 Encounter for screening for malignant neoplasm of rectum: Secondary | ICD-10-CM

## 2017-08-16 LAB — HEMOCCULT GUIAC POC 1CARD (OFFICE): Fecal Occult Blood, POC: NEGATIVE

## 2017-08-16 NOTE — Progress Notes (Signed)
Subjective:     Vickie [name] is a 56 y.o. female here for a routine exam.  No LMP recorded. Patient is postmenopausal. G2P2 Birth Control Method:  Menopausal  Menstrual Calendar(currently): amenorrheic  Current complaints: none.   Current acute medical issues:  none   Recent Gynecologic History No LMP recorded. Patient is postmenopausal. Last Pap: 2015,  normal Last mammogram: 2016,  normal  History reviewed. No pertinent past medical history.  History reviewed. No pertinent surgical history.  OB History    Gravida Para Term Preterm AB Living   SAB TAB Ectopic Multiple Live Births                  Social History   Social History  . Marital status: Married    Spouse name: N/A  . Number of children: N/A  . Years of education: N/A   Social History Main Topics  . Smoking status: Current Every Day Smoker    Packs/day: 1.00    Types: Cigarettes  . Smokeless tobacco: Never Used  . Alcohol use Yes  . Drug use: No  . Sexual activity: Not Currently    Birth control/ protection: Post-menopausal   Other Topics Concern  . None   Social History Narrative  . None    Family History  Problem Relation Age of Onset  . Heart disease Father   . Arthritis Mother   . Heart disease Mother   . Heart disease Brother   . Heart disease Brother      Current Outpatient Prescriptions:  .  ALPRAZolam (XANAX) 0.5 MG tablet, Take 0.5 mg by mouth at bedtime as needed.  , Disp: , Rfl:  .  BIOTIN 5000 PO, Take by mouth., Disp: , Rfl:  .  Multiple Vitamin (MULTIVITAMIN) tablet, Take 1 tablet by mouth daily., Disp: , Rfl:  .  Omega-3 Fatty Acids (FISH OIL) 1200 MG CPDR, Take by mouth., Disp: , Rfl:  .  Vitamin D, Ergocalciferol, (DRISDOL) 50000 UNITS CAPS capsule, Take 50,000 Units by mouth every 7 (seven) days., Disp: , Rfl:   Review of Systems  Review of Systems  Constitutional: Negative for fever, chills, weight loss, malaise/fatigue and diaphoresis.  HENT:  Negative for hearing loss, ear pain, nosebleeds, congestion, sore throat, neck pain, tinnitus and ear discharge.   Eyes: Negative for blurred vision, double vision, photophobia, pain, discharge and redness.  Respiratory: Negative for cough, hemoptysis, sputum production, shortness of breath, wheezing and stridor.   Cardiovascular: Negative for chest pain, palpitations, orthopnea, claudication, leg swelling and PND.  Gastrointestinal: negative for abdominal pain. Negative for heartburn, nausea, vomiting, diarrhea, constipation, blood in stool and melena.  Genitourinary: Negative for dysuria, urgency, frequency, hematuria and flank pain.  Musculoskeletal: Negative for myalgias, back pain, joint pain and falls.  Skin: Negative for itching and rash.  Neurological: Negative for dizziness, tingling, tremors, sensory change, speech change, focal weakness, seizures, loss of consciousness, weakness and headaches.  Endo/Heme/Allergies: Negative for environmental allergies and polydipsia. Does not bruise/bleed easily.  Psychiatric/Behavioral: Negative for depression, suicidal ideas, hallucinations, memory loss and substance abuse. The patient is not nervous/anxious and does not have insomnia.        Objective:  Blood pressure 118/62, pulse (!) 56, height  (1.549 m), weight 129 lb (58.5 kg).   Physical Exam  Vitals reviewed. Constitutional: She is oriented to person, place, and time. She appears well-developed and well-nourished.  HENT:  Head: Normocephalic  and atraumatic.        Right Ear: External ear normal.  Left Ear: External ear normal.  Nose: Nose normal.  Mouth/Throat: Oropharynx is clear and moist.  Eyes: Conjunctivae and EOM are normal. Pupils are equal, round, and reactive to light. Right eye exhibits no discharge. Left eye exhibits no discharge. No scleral icterus.  Neck: Normal range of motion. Neck supple. No tracheal deviation present. No thyromegaly present.  Cardiovascular:  Normal rate, regular rhythm, normal heart sounds and intact distal pulses.  Exam reveals no gallop and no friction rub.   No murmur heard. Respiratory: Effort normal and breath sounds normal. No respiratory distress. She has no wheezes. She has no rales. She exhibits no tenderness.  GI: Soft. Bowel sounds are normal. She exhibits no distension and no mass. There is no tenderness. There is no rebound and no guarding.  Genitourinary:  Breasts no masses skin changes or nipple changes bilaterally      Vulva is normal without lesions Vagina is pink moist without discharge Cervix normal in appearance and pap is done Uterus is normal size shape and contour Adnexa is negative with normal sized ovaries  {Rectal    hemoccult negative, normal tone, no masses  Musculoskeletal: Normal range of motion. She exhibits no edema and no tenderness.  Neurological: She is alert and oriented to person, place, and time. She has normal reflexes. She displays normal reflexes. No cranial nerve deficit. She exhibits normal muscle tone. Coordination normal.  Skin: Skin is warm and dry. No rash noted. No erythema. No pallor.  Psychiatric: She has a normal mood and affect. Her behavior is normal. Judgment and thought content normal.       Medications Ordered at today's visit: No orders of the defined types were placed in this encounter.   Other orders placed at today's visit: Orders Placed This Encounter  Procedures  . MM Digital Screening      Assessment:    Healthy female exam.    Plan:    Contraception: none. Mammogram ordered. Follow up in: 3 years.    Pt does not have insurance and I ordered a mammogram  But has not one in 2 years, will seek out programs to get it done.  Pt aware it has been ordered No Follow-up on file.

## 2017-08-16 NOTE — Addendum Note (Signed)
Addended by: Tish FredericksonLANCASTER, Daiwik Buffalo A on: 08/16/2017 01:32 PM   Modules accepted: Orders

## 2017-08-17 LAB — CYTOLOGY - PAP
Diagnosis: NEGATIVE
HPV: NOT DETECTED

## 2020-04-18 ENCOUNTER — Telehealth: Payer: Self-pay | Admitting: Obstetrics and Gynecology

## 2020-04-18 NOTE — Telephone Encounter (Signed)

## 2020-04-19 ENCOUNTER — Encounter: Payer: Self-pay | Admitting: Obstetrics and Gynecology

## 2020-04-19 ENCOUNTER — Other Ambulatory Visit: Payer: Self-pay

## 2020-04-19 ENCOUNTER — Other Ambulatory Visit (HOSPITAL_COMMUNITY)
Admission: RE | Admit: 2020-04-19 | Discharge: 2020-04-19 | Disposition: A | Discharge status: Home / Self Care | Payer: Self-pay | Source: Ambulatory Visit | Attending: Obstetrics and Gynecology | Admitting: Obstetrics and Gynecology

## 2020-04-19 ENCOUNTER — Ambulatory Visit: Payer: Self-pay | Admitting: Obstetrics and Gynecology

## 2020-04-19 VITALS — BP 123/77 | HR 72 | Ht 61.5 in | Wt 116.4 lb

## 2020-04-19 DIAGNOSIS — Z01419 Encounter for gynecological examination (general) (routine) without abnormal findings: Secondary | ICD-10-CM

## 2020-04-19 DIAGNOSIS — Z1272 Encounter for screening for malignant neoplasm of vagina: Secondary | ICD-10-CM

## 2020-04-19 NOTE — Progress Notes (Signed)
Patient ID: Vickie [name], female   DOB: 1961-06-24, 59 y.o.   MRN: [id number]   Assessment:  Annual Gyn Exam Plan:  1. Pap smear done, next pap due in 3-5 years 2. Return annually or prn 3    Annual mammogram advised after age 43 - [facility] Subjective:  Vickie [name] is a 59 y.o. female G2P2 who presents for annual exam. No LMP recorded. Patient is postmenopausal. Last PAP smear was on 08/16/2017 and was normal with negative HPV. The patient reports weight loss, anxiety, and trouble sleeping secondary to increased social stressors. She states that her son passed away and her mother recently had a stroke.  She particularly focuses on facial changes since she lost weight.  We discussed this at length  The following portions of the patient's history were reviewed and updated as appropriate: allergies, current medications, past family history, past medical history, past social history, past surgical history and problem list. No past medical history on file.  No past surgical history on file.   Current Outpatient Medications:  .  ALPRAZolam (XANAX) 0.5 MG tablet, Take 0.5 mg by mouth at bedtime as needed.  , Disp: , Rfl:  .  BIOTIN 5000 PO, Take by mouth., Disp: , Rfl:  .  Multiple Vitamin (MULTIVITAMIN) tablet, Take 1 tablet by mouth daily., Disp: , Rfl:  .  Omega-3 Fatty Acids (FISH OIL) 1200 MG CPDR, Take by mouth., Disp: , Rfl:  .  Vitamin D, Ergocalciferol, (DRISDOL) 50000 UNITS CAPS capsule, Take 50,000 Units by mouth every 7 (seven) days., Disp: , Rfl:   Review of Systems Constitutional: positive for weight loss Gastrointestinal: negative Genitourinary: negative Psychiatric: anxiety, increased stress, trouble sleeping  Objective:  There were no vitals taken for this visit.   BMI: There is no height or weight on file to calculate BMI.  General Appearance: Alert, appropriate appearance for age. No acute distress HEENT: Grossly normal Breast Exam: No dimpling, nipple  retraction or discharge. No masses or nodes. Normal to inspection and Normal breast tissue bilaterally Gastrointestinal: Soft, non-tender, no masses or organomegaly Pelvic Exam: Vulva and vagina appear normal. Bimanual exam reveals normal uterus and adnexa. External genitalia: normal general appearance Vaginal: normal mucosa without prolapse or lesions and normal without tenderness, induration or masses Rectovaginal: normal rectal, no masses and guaiac negative stool obtained Skin: no rash or abnormalities Neurologic: Normal gait and speech, no tremor  Psychiatric: Alert and oriented, appropriate affect.  Urinalysis:Not done  By signing my name below, I, Nikki Dom, attest that this documentation has been prepared under the direction and in the presence of Tilda Burrow, MD. Electronically Signed: [employer]. 04/19/20. 10:35 AM.  I personally performed the services described in this documentation, which was SCRIBED in my presence. The recorded information has been reviewed and considered accurate. It has been edited as necessary during review. Tilda Burrow, MD

## 2020-04-22 LAB — CYTOLOGY - PAP
Comment: NEGATIVE
Diagnosis: NEGATIVE
High risk HPV: NEGATIVE

## 2020-04-25 NOTE — Progress Notes (Signed)
Pap negative with negative HPV. May repeat pap in 3-5 yr.

## 2020-04-26 ENCOUNTER — Telehealth: Payer: Self-pay | Admitting: *Deleted

## 2020-04-26 NOTE — Telephone Encounter (Signed)
Telephoned patient at home number and advised patient of negative pap smear results. HPV was negative. Next pap smear due in 3-5 years. Patient voiced understanding.

## 2020-05-20 ENCOUNTER — Emergency Department (HOSPITAL_COMMUNITY): Admission: EM | Admit: 2020-05-20 | Discharge: 2020-05-20 | Discharge status: Against Medical Advice | Payer: Self-pay

## 2020-05-21 ENCOUNTER — Encounter (HOSPITAL_COMMUNITY): Payer: Self-pay

## 2020-05-21 ENCOUNTER — Other Ambulatory Visit: Payer: Self-pay

## 2020-05-21 ENCOUNTER — Emergency Department (HOSPITAL_COMMUNITY)
Admission: EM | Admit: 2020-05-21 | Discharge: 2020-05-21 | Disposition: A | Discharge status: Home / Self Care | Payer: Self-pay | Attending: Emergency Medicine | Admitting: Emergency Medicine

## 2020-05-21 DIAGNOSIS — L988 Other specified disorders of the skin and subcutaneous tissue: Secondary | ICD-10-CM | POA: Insufficient documentation

## 2020-05-21 DIAGNOSIS — R239 Unspecified skin changes: Secondary | ICD-10-CM

## 2020-05-21 DIAGNOSIS — F419 Anxiety disorder, unspecified: Secondary | ICD-10-CM | POA: Insufficient documentation

## 2020-05-21 DIAGNOSIS — F17210 Nicotine dependence, cigarettes, uncomplicated: Secondary | ICD-10-CM | POA: Insufficient documentation

## 2020-05-21 HISTORY — DX: Anxiety disorder, unspecified: F41.9

## 2020-05-21 LAB — COMPREHENSIVE METABOLIC PANEL
ALT: 18 U/L (ref 0–44)
AST: 19 U/L (ref 15–41)
Albumin: 4.1 g/dL (ref 3.5–5.0)
Alkaline Phosphatase: 90 U/L (ref 38–126)
Anion gap: 10 (ref 5–15)
BUN: 8 mg/dL (ref 6–20)
CO2: 26 mmol/L (ref 22–32)
Calcium: 9.3 mg/dL (ref 8.9–10.3)
Chloride: 105 mmol/L (ref 98–111)
Creatinine, Ser: 0.72 mg/dL (ref 0.44–1.00)
GFR calc Af Amer: >60 mL/min (ref >60)
GFR calc non Af Amer: >60 mL/min (ref >60)
Glucose, Bld: 98 mg/dL (ref 70–99)
Potassium: 4 mmol/L (ref 3.5–5.1)
Sodium: 141 mmol/L (ref 135–145)
Total Bilirubin: 1.1 mg/dL (ref 0.3–1.2)
Total Protein: 6.8 g/dL (ref 6.5–8.1)

## 2020-05-21 LAB — CBC WITH DIFFERENTIAL/PLATELET
Abs Immature Granulocytes: 0.02 10*3/uL (ref 0.00–0.07)
Basophils Absolute: 0 10*3/uL (ref 0.0–0.1)
Basophils Relative: 1 %
Eosinophils Absolute: 0 10*3/uL (ref 0.0–0.5)
Eosinophils Relative: 0 %
HCT: 43.9 % (ref 36.0–46.0)
Hemoglobin: 14.9 g/dL (ref 12.0–15.0)
Immature Granulocytes: 0 %
Lymphocytes Relative: 29 %
Lymphs Abs: 2.3 10*3/uL (ref 0.7–4.0)
MCH: 34.7 pg — ABNORMAL HIGH (ref 26.0–34.0)
MCHC: 33.9 g/dL (ref 30.0–36.0)
MCV: 102.1 fL — ABNORMAL HIGH (ref 80.0–100.0)
Monocytes Absolute: 0.4 10*3/uL (ref 0.1–1.0)
Monocytes Relative: 5 %
Neutro Abs: 5.2 10*3/uL (ref 1.7–7.7)
Neutrophils Relative %: 65 %
Platelets: 232 10*3/uL (ref 150–400)
RBC: 4.3 MIL/uL (ref 3.87–5.11)
RDW: 13.6 % (ref 11.5–15.5)
WBC: 7.9 10*3/uL (ref 4.0–10.5)
nRBC: 0 % (ref 0.0–0.2)

## 2020-05-21 LAB — URINALYSIS, ROUTINE W REFLEX MICROSCOPIC
Bilirubin Urine: NEGATIVE
Glucose, UA: NEGATIVE mg/dL
Hgb urine dipstick: NEGATIVE
Ketones, ur: NEGATIVE mg/dL
Leukocytes,Ua: NEGATIVE
Nitrite: NEGATIVE
Protein, ur: NEGATIVE mg/dL
Specific Gravity, Urine: 1.003 — ABNORMAL LOW (ref 1.005–1.030)
pH: 7 (ref 5.0–8.0)

## 2020-05-21 NOTE — ED Triage Notes (Signed)
Pt reports started mirtazapine 15mg  and says since then she's noticed multiple changes in her skin.    Reports dry mouth, sweaty, decreased skin turgor, blurred vision, confusion, agitation, and inability to concentrate at times.  Pt also c/o lower back pain since April.

## 2020-05-21 NOTE — ED Provider Notes (Signed)
[facility] EMERGENCY DEPARTMENT Provider Note   CSN: [id number] Arrival date & time: 05/21/20  1004     History No chief complaint on file.   Vickie [name] is a 59 y.o. female with a history of anxiety, presenting with a one month history of skin changes that have her very worried.  She describes dents and new wrinkles in her facial skin, also new crepiness in her arms, hands, feet and thighs and has noticed sagging of the skin on her back and buttocks.  Also reports dry mouth although she endorses drinking plenty of fluids.  Intermittent episodes of sweating.  Patient is postmenopausal.  She states her PCP checked her thyroid level prior to starting Remeron.  She was started on Remeron last month, took for 3 weeks.  She contacted her pcp who advised to dc this medicine as she was concerned this was the source of these sx.  She denies chest pain, sob, abd pain. Weight has been relatively stable, appetite ok.  She is a smoker.  No new lotions, skin care products.  Also no rash or erythema.  She has found no alleviators for her symptoms.  She does report that her mother had a stroke several months ago at which time the patient has become far less active than her normal routine.   The history is provided by the patient.       Past Medical History:  Diagnosis Date  . Anxiety     There are no problems to display for this patient.   History reviewed. No pertinent surgical history.   OB History    Gravida  2   Para  2   Term      Preterm      AB      Living  2     SAB      TAB      Ectopic      Multiple      Live Births              Family History  Problem Relation Age of Onset  . Heart disease Father   . Arthritis Mother   . Heart disease Mother   . Stroke Mother   . Heart disease Brother   . Heart disease Brother     Social History   Tobacco Use  . Smoking status: Current Every Day Smoker    Packs/day: 1.00    Types: Cigarettes  . Smokeless  tobacco: Never Used  Vaping Use  . Vaping Use: Never used  Substance Use Topics  . Alcohol use: Yes    Comment: occ  . Drug use: No    Home Medications Prior to Admission medications   Medication Sig Start Date End Date Taking? Authorizing Provider  ALPRAZolam Prudy Feeler) 0.5 MG tablet Take 0.5 mg by mouth at bedtime as needed.      [provider]  BIOTIN 5000 PO Take by mouth.    [provider]  Multiple Vitamin (MULTIVITAMIN) tablet Take 1 tablet by mouth daily.    [provider]  Omega-3 Fatty Acids (FISH OIL) 1200 MG CPDR Take by mouth.    [provider]  Vitamin D, Ergocalciferol, (DRISDOL) 50000 UNITS CAPS capsule Take 50,000 Units by mouth every 7 (seven) days.    [provider]    Allergies    Codeine and Penicillins  Review of Systems   Review of Systems  Constitutional: Negative for chills and fever.  HENT: Negative  for congestion and sore throat.   Eyes: Negative.   Respiratory: Negative for chest tightness and shortness of breath.   Cardiovascular: Negative for chest pain.  Gastrointestinal: Negative for abdominal pain and nausea.  Genitourinary: Negative.   Musculoskeletal: Negative for arthralgias, joint swelling and neck pain.  Skin: Negative.  Negative for rash and wound.       Negative except as mentioned in HPI.   Neurological: Negative for dizziness, weakness, light-headedness, numbness and headaches.  Psychiatric/Behavioral: The patient is nervous/anxious.     Physical Exam Updated Vital Signs BP 118/79 (BP Location: Right Arm)   Pulse 73   Temp 98.6 F (37 C) (Oral)   Resp 20   Ht 5\' 1"  (1.549 m)   Wt 50.8 kg   SpO2 100%   BMI 21.16 kg/m   Physical Exam Vitals and nursing note reviewed.  Constitutional:      Appearance: Normal appearance. She is well-developed.  HENT:     Head: Normocephalic and atraumatic.  Eyes:     Conjunctiva/sclera: Conjunctivae normal.  Cardiovascular:     Rate and  Rhythm: Normal rate and regular rhythm.     Heart sounds: Normal heart sounds.  Pulmonary:     Effort: Pulmonary effort is normal.     Breath sounds: Normal breath sounds. No wheezing.  Abdominal:     General: Bowel sounds are normal.     Palpations: Abdomen is soft.     Tenderness: There is no abdominal tenderness.  Musculoskeletal:        General: Normal range of motion.     Cervical back: Normal range of motion.  Skin:    General: Skin is warm and dry.  Neurological:     General: No focal deficit present.     Mental Status: She is alert and oriented to person, place, and time.  Psychiatric:        Mood and Affect: Mood is anxious.     ED Results / Procedures / Treatments   Labs (all labs ordered are listed, but only abnormal results are displayed) Labs Reviewed  CBC WITH DIFFERENTIAL/PLATELET - Abnormal; Notable for the following components:      Result Value   MCV 102.1 (*)    MCH 34.7 (*)    All other components within normal limits  URINALYSIS, ROUTINE W REFLEX MICROSCOPIC - Abnormal; Notable for the following components:   Color, Urine STRAW (*)    Specific Gravity, Urine 1.003 (*)    All other components within normal limits  COMPREHENSIVE METABOLIC PANEL    EKG None  Radiology No results found.  Procedures Procedures (including critical care time)  Medications Ordered in ED Medications - No data to display  ED Course  I have reviewed the triage vital signs and the nursing notes.  Pertinent labs & imaging results that were available during my care of the patient were reviewed by me and considered in my medical decision making (see chart for details).    MDM Rules/Calculators/A&P                          Labs reviewed including normal kidney and liver function, electrolytes are normal.  Patient does not have any skin edema on exam although she has complaints of the symptom.  Her exam suggest possible dry skin in combination with normal skin aging,  although she endorses these changes occurred just within the past month.  I do not have a good explanation  for this.  She has been in contact with her PCP about this problem.  He had advised her to DC her Remeron which she has done.  She is scheduled to see him back for a visit within the next month.  I encouraged her follow-up with him for further advice and management.  She may also benefit from a dermatologist if she continues to have symptoms. Final Clinical Impression(s) / ED Diagnoses Final diagnoses:  Skin complaints    Rx / DC Orders ED Discharge Orders    None       Victoriano Lain 05/21/20 1337    Derwood Kaplan, MD 05/22/20 [phone]

## 2020-05-21 NOTE — Discharge Instructions (Addendum)
Your lab tests today are negative including no sign of excessive dehydration, your liver and kidney functions are completely normal.  You are not anemic and your electrolytes are all normal today as well.  These are all reassuring findings.  It is unclear for the reason that you have noticed the skin changes.  I recommend follow-up with your primary doctor.  You may also want to consider seeing a dermatologist for further evaluation if you continue to have these problems.

## 2020-05-28 ENCOUNTER — Encounter (HOSPITAL_COMMUNITY): Payer: Self-pay | Admitting: *Deleted

## 2020-05-28 ENCOUNTER — Emergency Department (HOSPITAL_COMMUNITY): Payer: Self-pay

## 2020-05-28 ENCOUNTER — Emergency Department (HOSPITAL_COMMUNITY)
Admission: EM | Admit: 2020-05-28 | Discharge: 2020-05-28 | Disposition: A | Discharge status: Home / Self Care | Payer: Self-pay | Attending: Emergency Medicine | Admitting: Emergency Medicine

## 2020-05-28 ENCOUNTER — Other Ambulatory Visit: Payer: Self-pay

## 2020-05-28 DIAGNOSIS — Z880 Allergy status to penicillin: Secondary | ICD-10-CM | POA: Insufficient documentation

## 2020-05-28 DIAGNOSIS — R197 Diarrhea, unspecified: Secondary | ICD-10-CM

## 2020-05-28 DIAGNOSIS — F17210 Nicotine dependence, cigarettes, uncomplicated: Secondary | ICD-10-CM | POA: Insufficient documentation

## 2020-05-28 DIAGNOSIS — Z885 Allergy status to narcotic agent status: Secondary | ICD-10-CM | POA: Insufficient documentation

## 2020-05-28 DIAGNOSIS — K219 Gastro-esophageal reflux disease without esophagitis: Secondary | ICD-10-CM | POA: Insufficient documentation

## 2020-05-28 HISTORY — DX: Depression, unspecified: F32.A

## 2020-05-28 LAB — COMPREHENSIVE METABOLIC PANEL
ALT: 19 U/L (ref 0–44)
AST: 25 U/L (ref 15–41)
Albumin: 4.1 g/dL (ref 3.5–5.0)
Alkaline Phosphatase: 103 U/L (ref 38–126)
Anion gap: 13 (ref 5–15)
BUN: 9 mg/dL (ref 6–20)
CO2: 27 mmol/L (ref 22–32)
Calcium: 9.4 mg/dL (ref 8.9–10.3)
Chloride: 103 mmol/L (ref 98–111)
Creatinine, Ser: 0.87 mg/dL (ref 0.44–1.00)
GFR calc Af Amer: >60 mL/min (ref >60)
GFR calc non Af Amer: >60 mL/min (ref >60)
Glucose, Bld: 107 mg/dL — ABNORMAL HIGH (ref 70–99)
Potassium: 3.4 mmol/L — ABNORMAL LOW (ref 3.5–5.1)
Sodium: 143 mmol/L (ref 135–145)
Total Bilirubin: 0.7 mg/dL (ref 0.3–1.2)
Total Protein: 6.7 g/dL (ref 6.5–8.1)

## 2020-05-28 LAB — CBC WITH DIFFERENTIAL/PLATELET
Abs Immature Granulocytes: 0.02 10*3/uL (ref 0.00–0.07)
Basophils Absolute: 0 10*3/uL (ref 0.0–0.1)
Basophils Relative: 1 %
Eosinophils Absolute: 0 10*3/uL (ref 0.0–0.5)
Eosinophils Relative: 0 %
HCT: 45.4 % (ref 36.0–46.0)
Hemoglobin: 14.9 g/dL (ref 12.0–15.0)
Immature Granulocytes: 0 %
Lymphocytes Relative: 20 %
Lymphs Abs: 1.5 10*3/uL (ref 0.7–4.0)
MCH: 33.4 pg (ref 26.0–34.0)
MCHC: 32.8 g/dL (ref 30.0–36.0)
MCV: 101.8 fL — ABNORMAL HIGH (ref 80.0–100.0)
Monocytes Absolute: 0.5 10*3/uL (ref 0.1–1.0)
Monocytes Relative: 7 %
Neutro Abs: 5.4 10*3/uL (ref 1.7–7.7)
Neutrophils Relative %: 72 %
Platelets: 278 10*3/uL (ref 150–400)
RBC: 4.46 MIL/uL (ref 3.87–5.11)
RDW: 13.3 % (ref 11.5–15.5)
WBC: 7.4 10*3/uL (ref 4.0–10.5)
nRBC: 0 % (ref 0.0–0.2)

## 2020-05-28 LAB — URINALYSIS, ROUTINE W REFLEX MICROSCOPIC
Bilirubin Urine: NEGATIVE
Glucose, UA: NEGATIVE mg/dL
Hgb urine dipstick: NEGATIVE
Ketones, ur: NEGATIVE mg/dL
Leukocytes,Ua: NEGATIVE
Nitrite: NEGATIVE
Protein, ur: NEGATIVE mg/dL
Specific Gravity, Urine: 1.005 (ref 1.005–1.030)
pH: 7 (ref 5.0–8.0)

## 2020-05-28 LAB — LIPASE, BLOOD: Lipase: 34 U/L (ref 11–51)

## 2020-05-28 MED ORDER — SODIUM CHLORIDE 0.9 % IV BOLUS
1000.0000 mL | Freq: Once | INTRAVENOUS | Status: AC
  Administered 2020-05-28: 1000 mL via INTRAVENOUS

## 2020-05-28 MED ORDER — IOHEXOL 300 MG/ML  SOLN
75.0000 mL | Freq: Once | INTRAMUSCULAR | Status: AC | PRN
  Administered 2020-05-28: 75 mL via INTRAVENOUS

## 2020-05-28 MED ORDER — LIDOCAINE VISCOUS HCL 2 % MT SOLN: 15.0000 mL | Freq: Once | OROMUCOSAL | Status: DC

## 2020-05-28 MED ORDER — IOHEXOL 300 MG/ML  SOLN: 100.0000 mL | Freq: Once | INTRAMUSCULAR | Status: DC | PRN

## 2020-05-28 MED ORDER — ALUM & MAG HYDROXIDE-SIMETH 200-200-20 MG/5ML PO SUSP
30.0000 mL | Freq: Once | ORAL | Status: AC
  Administered 2020-05-28: 30 mL via ORAL
  Filled 2020-05-28: qty 30

## 2020-05-28 MED ORDER — LIDOCAINE VISCOUS HCL 2 % MT SOLN
15.0000 mL | Freq: Once | OROMUCOSAL | Status: AC
  Administered 2020-05-28: 15 mL via ORAL
  Filled 2020-05-28: qty 15

## 2020-05-28 MED ORDER — ALUM & MAG HYDROXIDE-SIMETH 200-200-20 MG/5ML PO SUSP: 30.0000 mL | Freq: Once | ORAL | Status: DC

## 2020-05-28 NOTE — ED Notes (Signed)
ED Provider at bedside. 

## 2020-05-28 NOTE — ED Notes (Signed)
Pt returned from CT °

## 2020-05-28 NOTE — ED Triage Notes (Signed)
With diarrhea for a week.  Was on Clindamycin 04/17/20 and was stopped on 5/50/21.  Started on Mirtazapine on 04/29/20 and stopped on 05/20/20.  C/o gas, bloating, nausea.

## 2020-05-28 NOTE — Discharge Instructions (Addendum)
You were seen today for your diarrhea.  Your imaging, labs look reassuring.  Your CT did show cholelithiasis, which is what we spoke about with your gallbladder.  I want you to follow-up with Dr.Rourk for your ongoing GI symptoms.  In regards to your acid reflux, you can use the over the counter acid reducer medications as we spoke about. In regards to your diarrhea, I want you to try and eat as much fiber as possible.  You can do this in your diet or you can buy fiber pills.  You should use the guides attached.

## 2020-05-28 NOTE — ED Notes (Signed)
Pt given GI cocktail and water. Pt with no difficulty swallowing. No coughing or emesis noted.

## 2020-05-28 NOTE — ED Provider Notes (Signed)
[facility] EMERGENCY DEPARTMENT Provider Note   CSN: [id number] Arrival date & time: 05/28/20  1126     History Chief Complaint  Patient presents with  . Diarrhea    Vickie [name] is a 59 y.o. female with no pertinent past medical history that presents the emergency department today for diarrhea.  States that she was on clindamycin for dental issues and stopped taking it on 5/20, states that she took it for 9 days.  Started Remeron on 5/24 and stopped taking it on 6/14 because she thought that there was interactions between the antibiotics and the Remeron.  Patient states that for the past week she has noticed that she has had bloating with extreme amounts of gas.  She states that she has been having tenesmus with diarrhea.  Denies any blood in her stools.  States that her stools have been very mucousy.  Also admits to abdominal pain, states that she had left lower quadrant abdominal pain last night.  Also admits to some nausea, does not feel nauseous right now.  Did vomit this morning, no hematemesis or coffee-ground emesis.  Patient states that she vomited mucus.  States that she was in normal health before this.  States that she is fairly healthy.  Denies any chest pain, shortness of breath, back pain, leg edema, headache, vision changes, weakness, trouble walking.  Denies any abdominal surgeries.  Has not taken anything for this.  Was not recently in the hospital.  No dysuria or hematuria.  When discharging this patient she states that she is now having trouble getting food down her throat. States that she forgot to tell us this. States that she wants to get her esophagus checked out while she is in the emergency department. Was able to eat breakfast without any difficulty this morning. Is handling secretions. States this started a week ago.  Denies any odynophagia.    HPI     Past Medical History:  Diagnosis Date  . Anxiety   . Depression     There are no problems to display  for this patient.   History reviewed. No pertinent surgical history.   OB History    Gravida  2   Para  2   Term      Preterm      AB      Living  2     SAB      TAB      Ectopic      Multiple      Live Births              Family History  Problem Relation Age of Onset  . Heart disease Father   . Arthritis Mother   . Heart disease Mother   . Stroke Mother   . Heart disease Brother   . Heart disease Brother     Social History   Tobacco Use  . Smoking status: Current Every Day Smoker    Packs/day: 1.00    Types: Cigarettes  . Smokeless tobacco: Never Used  Vaping Use  . Vaping Use: Never used  Substance Use Topics  . Alcohol use: Yes    Comment: occ  . Drug use: No    Home Medications Prior to Admission medications   Medication Sig Start Date End Date Taking? Authorizing Provider  albuterol (VENTOLIN HFA) 108 (90 Base) MCG/ACT inhaler Inhale 1-2 puffs into the lungs every 6 (six) hours as needed for wheezing or shortness of breath.   Yes  [provider]  ALPRAZolam Duanne Moron) 1 MG tablet Take 1 mg by mouth 4 (four) times daily as needed for anxiety.    Yes [provider]  ascorbic acid (VITAMIN C) 500 MG tablet Take 500 mg by mouth daily.   Yes [provider]  Biotin (BIOTIN 5000) 5 MG CAPS Take 1 tablet by mouth daily.    Yes [provider]  cholecalciferol (VITAMIN D3) 25 MCG (1000 UNIT) tablet Take 1,000 Units by mouth daily.   Yes [provider]  Multiple Vitamin (MULTIVITAMIN) tablet Take 1 tablet by mouth daily.   Yes [provider]  Omega-3 Fatty Acids (FISH OIL) 1200 MG CPDR Take 1 capsule by mouth daily.    Yes [provider]    Allergies    Codeine and Penicillins  Review of Systems   Review of Systems  Constitutional: Negative for chills, diaphoresis, fatigue and fever.  HENT: Negative for congestion, sore throat and trouble swallowing.   Eyes: Negative for pain and  visual disturbance.  Respiratory: Negative for cough, shortness of breath and wheezing.   Cardiovascular: Negative for chest pain, palpitations and leg swelling.  Gastrointestinal: Positive for abdominal pain, diarrhea, nausea and vomiting. Negative for abdominal distention.  Genitourinary: Negative for difficulty urinating.  Musculoskeletal: Negative for back pain, neck pain and neck stiffness.  Skin: Negative for pallor.  Neurological: Negative for dizziness, speech difficulty, weakness and headaches.  Psychiatric/Behavioral: Negative for confusion.    Physical Exam Updated Vital Signs BP 128/78 (BP Location: Left Arm)   Pulse (!) 57   Temp 98 F (36.7 C) (Oral)   Resp 14   Ht 5\' 1"  (1.549 m)   Wt 50.8 kg   SpO2 100%   BMI 21.16 kg/m   Physical Exam Constitutional:      General: She is not in acute distress.    Appearance: Normal appearance. She is not ill-appearing, toxic-appearing or diaphoretic.  HENT:     Mouth/Throat:     Mouth: Mucous membranes are moist.     Pharynx: Oropharynx is clear.  Eyes:     General: No scleral icterus.    Extraocular Movements: Extraocular movements intact.     Pupils: Pupils are equal, round, and reactive to light.  Cardiovascular:     Rate and Rhythm: Normal rate and regular rhythm.     Pulses: Normal pulses.     Heart sounds: Normal heart sounds.  Pulmonary:     Effort: Pulmonary effort is normal. No respiratory distress.     Breath sounds: Normal breath sounds. No stridor. No wheezing, rhonchi or rales.  Chest:     Chest wall: No tenderness.  Abdominal:     General: Abdomen is flat. Bowel sounds are normal. There is no distension.     Palpations: Abdomen is soft.     Tenderness: There is no abdominal tenderness. There is no right CVA tenderness, left CVA tenderness, guarding or rebound. Negative signs include Murphy's sign, Rovsing's sign, McBurney's sign, psoas sign and obturator sign.     Comments: No signs of surgical abdomen.    Musculoskeletal:        General: No swelling or tenderness. Normal range of motion.     Cervical back: Normal range of motion and neck supple. No rigidity.     Right lower leg: No edema.     Left lower leg: No edema.  Skin:    General: Skin is warm and dry.     Capillary Refill: Capillary refill  takes less than 2 seconds.     Coloration: Skin is not pale.  Neurological:     General: No focal deficit present.     Mental Status: She is alert and oriented to person, place, and time.  Psychiatric:        Mood and Affect: Mood normal.        Behavior: Behavior normal.     ED Results / Procedures / Treatments   Labs (all labs ordered are listed, but only abnormal results are displayed) Labs Reviewed  CBC WITH DIFFERENTIAL/PLATELET - Abnormal; Notable for the following components:      Result Value   MCV 101.8 (*)    All other components within normal limits  COMPREHENSIVE METABOLIC PANEL - Abnormal; Notable for the following components:   Potassium 3.4 (*)    Glucose, Bld 107 (*)    All other components within normal limits  GASTROINTESTINAL PANEL BY PCR, STOOL (REPLACES STOOL CULTURE)  C DIFFICILE QUICK SCREEN W PCR REFLEX  LIPASE, BLOOD  URINALYSIS, ROUTINE W REFLEX MICROSCOPIC    EKG None  Radiology DG Neck Soft Tissue  Result Date: 05/28/2020 CLINICAL DATA:  Difficulty swallowing. EXAM: NECK SOFT TISSUES - 1+ VIEW COMPARISON:  None. FINDINGS: There is no evidence of retropharyngeal soft tissue swelling or epiglottic enlargement. The cervical airway is unremarkable and no radio-opaque foreign body identified. There is approximately 2 mm retrolisthesis of the C4 vertebral body on C5. Moderate severity multilevel endplate sclerosis is seen at the level of C4-C5 and C5-C6. Mild to moderate severity intervertebral disc space narrowing is also seen at this level. IMPRESSION: 1. No evidence of retropharyngeal soft tissue swelling or epiglottic enlargement. 2. Degenerative changes  at C4-C5 and C5-C6. Electronically Signed   By: Aram Candela M.D.   On: 05/28/2020 17:07   CT ABDOMEN PELVIS W CONTRAST  Result Date: 05/28/2020 CLINICAL DATA:  Left lower quadrant pain for 1 week EXAM: CT ABDOMEN AND PELVIS WITH CONTRAST TECHNIQUE: Multidetector CT imaging of the abdomen and pelvis was performed using the standard protocol following bolus administration of intravenous contrast. CONTRAST:  63mL OMNIPAQUE IOHEXOL 300 MG/ML  SOLN COMPARISON:  02/17/2017 FINDINGS: Lower chest: No acute abnormality. Hepatobiliary: Cholelithiasis is identified without complicating factors. Liver is within normal limits. Pancreas: Unremarkable. No pancreatic ductal dilatation or surrounding inflammatory changes. Spleen: Normal in size without focal abnormality. Adrenals/Urinary Tract: Adrenal glands are unremarkable. Kidneys are normal, without renal calculi, focal lesion, or hydronephrosis. Bladder is unremarkable. Stomach/Bowel: The appendix is well visualized and air filled. No obstructive or inflammatory changes of the large or small bowel are seen. Sliding-type hiatal hernia is noted. Vascular/Lymphatic: No significant vascular findings are present. No enlarged abdominal or pelvic lymph nodes. Reproductive: Uterus and bilateral adnexa are unremarkable. Other: No abdominal wall hernia or abnormality. No abdominopelvic ascites. Musculoskeletal: No acute or significant osseous findings. IMPRESSION: Cholelithiasis without complicating factors. No other focal abnormality is noted. Electronically Signed   By: Alcide Clever M.D.   On: 05/28/2020 15:10    Procedures Procedures (including critical care time)  Medications Ordered in ED Medications  sodium chloride 0.9 % bolus 1,000 mL (0 mLs Intravenous Stopped 05/28/20 1336)  iohexol (OMNIPAQUE) 300 MG/ML solution 75 mL (75 mLs Intravenous Contrast Given 05/28/20 1429)  alum & mag hydroxide-simeth (MAALOX/MYLANTA) 200-200-20 MG/5ML suspension 30 mL (30 mLs  Oral Given 05/28/20 1540)    And  lidocaine (XYLOCAINE) 2 % viscous mouth solution 15 mL (15 mLs Oral Given 05/28/20 1541)  ED Course  I have reviewed the triage vital signs and the nursing notes.  Pertinent labs & imaging results that were available during my care of the patient were reviewed by me and considered in my medical decision making (see chart for details).    MDM Rules/Calculators/A&P                         EVA GRIFFO is a 59 y.o. female with no pertinent past medical history that presents the emergency department today for diarrhea.  Patient also had left lower quadrant abdominal pain last night, states that it is progressively getting better.  Denies any pain on exam today.  No surgical abdomen.  Differential to include diverticulitis, infectious colitis, infectious diarrhea, gastroenteritis, colon cancer.  Will obtain imaging and labs.  CBC and CMP stable. CBC without leukocytosis. I am not concerned about C. difficile at this time. Patient has not had any episodes of diarrhea while in the emergency department, has been over 6 hours. CT negative, does show mild cholelithiasis. Did discuss this with patient, patient is not having any right upper quadrant pain.When discharging patient, patient states that she needs to have her esophagus checked out now. States that she really wants a x-ray of her throat so she can know that her esophagus is fine. States that she is having trouble swallowing which just started, I was able to watch her swallow her own spit which was fine. Nursing also watched her drink Maalox which she did not have any complications with.  X-ray negative.  Patient most likely with infectious diarrhea, I think this will self resolve.  Patient most likely also with acid reflux.  Discussed that patient should have PPI, patient states that she would rather have over-the-counter medications.  Will refer to GI, did give patient education on reflux and diarrhea.  Patient  agreeable for discharge.  Doubt need for further emergent work up at this time. I explained the diagnosis and have given explicit precautions to return to the ER including for any other new or worsening symptoms. The patient understands and accepts the medical plan as it's been dictated and I have answered their questions. Discharge instructions concerning home care and prescriptions have been given. The patient is STABLE and is discharged to home in good condition.  Final Clinical Impression(s) / ED Diagnoses Final diagnoses:  Diarrhea, unspecified type  Gastroesophageal reflux disease, unspecified whether esophagitis present    Rx / DC Orders ED Discharge Orders    None       Farrel Gordon, PA-C 05/28/20 1757    Sabas Sous, MD 05/29/20 2036

## 2020-05-30 ENCOUNTER — Telehealth: Payer: Self-pay | Admitting: *Deleted

## 2020-05-30 NOTE — Telephone Encounter (Signed)
Pt called in yesterday and scheduled a follow up appt from [facility] ER.  Pt having diarrhea and coughing after eating and drinking.  Pt called in today requesting medical advice until her appt with [place].  She stated that she called her PCP this morning and he sent in an acid reducer for her but she wanted to know how to manage diarrhea.  Explained to pt that I could review her ER discharge summary and recommendations but could not give medical advice.  Her discharge instructions and recommendations were to take anti diarrhea medication.  Pt stated that she did not know what to take.  I told her that Imodium is one medication that is used to relieve diarrhea.  She was informed that we could give medical advice once she is seen but until then she could call her PCP.

## 2020-06-04 ENCOUNTER — Other Ambulatory Visit: Payer: Self-pay

## 2020-06-04 ENCOUNTER — Encounter: Payer: Self-pay | Admitting: Nurse Practitioner

## 2020-06-04 ENCOUNTER — Ambulatory Visit: Payer: Self-pay | Admitting: Nurse Practitioner

## 2020-06-04 DIAGNOSIS — R1310 Dysphagia, unspecified: Secondary | ICD-10-CM

## 2020-06-04 DIAGNOSIS — R634 Abnormal weight loss: Secondary | ICD-10-CM

## 2020-06-04 DIAGNOSIS — R197 Diarrhea, unspecified: Secondary | ICD-10-CM

## 2020-06-04 DIAGNOSIS — K219 Gastro-esophageal reflux disease without esophagitis: Secondary | ICD-10-CM

## 2020-06-04 DIAGNOSIS — R1319 Other dysphagia: Secondary | ICD-10-CM

## 2020-06-04 NOTE — Patient Instructions (Signed)
Your health issues we discussed today were:   GERD (reflux/heartburn) with dysphagia (difficulty swallowing): 1. Continue taking Protonix 40 mg twice a day. 2. This can take up to 2 to 3 weeks to fully kick in 3. Due to your swallowing difficulties and weight loss we will plan for an upper endoscopy with possible dilation to evaluate your esophagus, stomach, first part of your small intestines 4. Further recommendations will follow 5. Call us if you have any worsening or severe symptoms in the meantime  Diarrhea and constipation: 1. Make sure you are drinking plenty of water 2. Continue to eat fibers foods such as fruits and vegetables, whole grains 3. Start taking a fiber supplement such as Benefiber, fiber chews, gummy chews.  Start this once a day for 2 weeks and then increase to twice a day 4. Call us if you have any worsening or severe symptoms  Weight loss: 1. We will plan to do a colonoscopy in addition to your upper endoscopy because you are due for these as well as to evaluate for possible causes of weight loss 2. For your dry mouth, call your primary care provider and tell them that it has been several days or weeks since you have stopped taking your other medications and you are still having dry mouth  Overall I recommend:  1. Continue your other current medications 2. Return for follow-up in 4 months 3. Call us if you have any questions or concerns  At [facility] we value your feedback. You may receive a survey about your visit today. Please share your experience as we strive to create trusting relationships with our patients to provide genuine, compassionate, quality care.  We appreciate your understanding and patience as we review any laboratory studies, imaging, and other diagnostic tests that are ordered as we care for you. Our office policy is 5 business days for review of these results, and any emergent or urgent results are addressed in a timely manner  for your best interest. If you do not hear from our office in 1 week, please contact us.   We also encourage the use of MyChart, which contains your medical information for your review as well. If you are not enrolled in this feature, an access code is on this after visit summary for your convenience. Thank you for allowing [place] to be involved in your care.  It was great to see you today!  I hope you have a great Summer!!     ---------------------------------------------------------------    ---------------------------------------------------------------

## 2020-06-04 NOTE — Assessment & Plan Note (Signed)
The patient describes weight loss of about 11 pounds in the past 2 months.  She is also having more global symptoms including changes in stools, newly symptomatic GERD, dysphagia.  States she has never had a colonoscopy, but has had stool tests.  At this point given her symptoms I recommended a colonoscopy to further evaluate.  Further recommendations to follow.  Follow-up in 4 months.  Proceed with TCS on propofol/MAC with Dr. Gala Romney in near future: the risks, benefits, and alternatives have been discussed with the patient in detail. The patient states understanding and desires to proceed.  The patient is currently on Xanax.  She is also quite anxious. The patient is not on any other anticoagulants, anxiolytics, chronic pain medications, antidepressants, antidiabetics, or iron supplements.  We will plan for the procedure on propofol/MAC to promote adequate sedation.

## 2020-06-04 NOTE — Assessment & Plan Note (Signed)
The patient describes her diarrhea as at this point intermittent with constipation.  Query possible overflow diarrhea related to constipation.  I do not doubt she had more persistent diarrhea previously while she was on antibiotics.  I doubt C. difficile or other colon infection given her nonpersistent nature and no profuse watery diarrhea.  At this point I recommended she take a fiber supplement to help have more regular, consistent bowel movements.  Follow-up in 4 months.  Call for any worsening or severe problems.

## 2020-06-04 NOTE — Progress Notes (Signed)
Primary Care Physician:  Elfredia Nevins, MD Primary Gastroenterologist:  Dr. Jena Gauss  Chief Complaint  Patient presents with  . Diarrhea  . Dysphagia    feels like lump in throat  . burping    HPI:   Vickie [name] is a 59 y.o. female who presents for emergency room follow-up for diarrhea and "coughing after eating."  Reviewed her ER visit dated 05/28/2020.  Patient noted to have essentially no medical history other than anxiety and depression.  She was on clindamycin for dental care for 9 days.  Also briefly on Remeron but she stopped because she felt that they were possible drug to drug interactions.  Noted bloating with extreme flatulence for the previous week associated with tenesmus and diarrhea.  Denies blood in her stools, although they are mucousy.  Noted left lower quadrant abdominal pain, some nausea that is intermittent and one episode of vomiting.  Near discharge from the ER she noted difficulty with swallowing intermittently that started about a week ago.  No odynophagia.  CBC, CMP, lipase all essentially normal other than mild hypokalemia at 3.4.  Urinalysis also normal.  No stool studies were performed.  CT of the abdomen and pelvis completed found cholelithiasis without complicating factors and no obvious cholecystitis.  No other acute findings.  Specifically no obstructive or inflammatory changes noted in the stomach/bowel.  A neck soft tissue x-ray was also completed due to her complaints of dysphagia which found no acute process.  Today she states she's doing ok overall. Is on Pantoprazole 40 mg bid, which is helping some. Has never had any problems with GI previously. Has been having dry mouth. When she was on antibiotic, Remeron, Xanax this is when dry mouth started. Diarrhea is improved somewhat. Had constipation this morning, drank coffee, had a bowel movement; follow-up bowel movement is loose. She is not taking anything for diarrhea. Has "tried to eat more fiber." Has  never had a colonoscopy before. Denies hematochezia, melena. Notes 11 lb weight loss in the past 2 months, poor appetite. Has coughing after eating. Denies abdominal pain, fever, chills. Does have some bloating. Denies chest pain, dyspnea, dizziness, lightheadedness, syncope, near syncope. Denies any other upper or lower GI symptoms.  Has never had a colonoscopy before.  Past Medical History:  Diagnosis Date  . Anxiety   . Depression     History reviewed. No pertinent surgical history.  Current Outpatient Medications  Medication Sig Dispense Refill  . albuterol (VENTOLIN HFA) 108 (90 Base) MCG/ACT inhaler Inhale 1-2 puffs into the lungs every 6 (six) hours as needed for wheezing or shortness of breath.    . ALPRAZolam (XANAX) 1 MG tablet Take 1 mg by mouth 4 (four) times daily as needed for anxiety.     . pantoprazole (PROTONIX) 40 MG tablet Take 40 mg by mouth daily.    Marland Kitchen ascorbic acid (VITAMIN C) 500 MG tablet Take 500 mg by mouth daily. (Patient not taking: Reported on 06/04/2020)    . Biotin (BIOTIN 5000) 5 MG CAPS Take 1 tablet by mouth daily.  (Patient not taking: Reported on 06/04/2020)    . cholecalciferol (VITAMIN D3) 25 MCG (1000 UNIT) tablet Take 1,000 Units by mouth daily. (Patient not taking: Reported on 06/04/2020)    . Multiple Vitamin (MULTIVITAMIN) tablet Take 1 tablet by mouth daily. (Patient not taking: Reported on 06/04/2020)    . Omega-3 Fatty Acids (FISH OIL) 1200 MG CPDR Take 1 capsule by mouth daily.  (Patient not taking:  Reported on 06/04/2020)     No current facility-administered medications for this visit.    Allergies as of 06/04/2020 - Review Complete 06/04/2020  Allergen Reaction Noted  . Codeine Rash 08/11/2011  . Penicillins Rash 08/11/2011    Family History  Problem Relation Age of Onset  . Heart disease Father   . Arthritis Mother   . Heart disease Mother   . Stroke Mother   . Heart disease Brother   . Heart disease Brother   . Colon cancer Neg Hx      Social History   Socioeconomic History  . Marital status: Married    Spouse name: Not on file  . Number of children: Not on file  . Years of education: Not on file  . Highest education level: Not on file  Occupational History  . Not on file  Tobacco Use  . Smoking status: Current Every Day Smoker    Packs/day: 1.00    Types: Cigarettes  . Smokeless tobacco: Never Used  Vaping Use  . Vaping Use: Never used  Substance and Sexual Activity  . Alcohol use: Not Currently    Comment: occ  . Drug use: No  . Sexual activity: Not Currently    Birth control/protection: Post-menopausal  Other Topics Concern  . Not on file  Social History Narrative  . Not on file   Social Determinants of Health   Financial Resource Strain: Low Risk   . Difficulty of Paying Living Expenses: Not hard at all  Food Insecurity: No Food Insecurity  . Worried About Programme researcher, broadcasting/film/video in the Last Year: Never true  . Ran Out of Food in the Last Year: Never true  Transportation Needs: No Transportation Needs  . Lack of Transportation (Medical): No  . Lack of Transportation (Non-Medical): No  Physical Activity: Sufficiently Active  . Days of Exercise per Week: 5 days  . Minutes of Exercise per Session: 30 min  Stress:   . Feeling of Stress :   Social Connections: Moderately Isolated  . Frequency of Communication with Friends and Family: More than three times a week  . Frequency of Social Gatherings with Friends and Family: Three times a week  . Attends Religious Services: Never  . Active Member of Clubs or Organizations: No  . Attends Banker Meetings: Never  . Marital Status: Living with partner  Intimate Partner Violence: Not At Risk  . Fear of Current or Ex-Partner: No  . Emotionally Abused: No  . Physically Abused: No  . Sexually Abused: No    Subjective: Review of Systems  Constitutional: Negative for chills, fever, malaise/fatigue and weight loss.  HENT: Negative for  congestion and sore throat.        Complains of dry mouth  Eyes:       Poor vision  Respiratory: Positive for cough and sputum production. Negative for shortness of breath.        Noted smoker  Cardiovascular: Negative for chest pain and palpitations.  Gastrointestinal: Positive for constipation, diarrhea, heartburn, nausea and vomiting. Negative for abdominal pain, blood in stool and melena.  Musculoskeletal: Negative for joint pain and myalgias.  Skin: Negative for rash.  Neurological: Negative for dizziness and weakness.  Endo/Heme/Allergies: Does not bruise/bleed easily.  Psychiatric/Behavioral: Positive for depression. The patient is nervous/anxious.        History of anxiety/depression; treated with Xanax  All other systems reviewed and are negative.      Objective: BP 124/81   Pulse  83   Temp (!) 97.4 F (36.3 C) (Oral)   Ht 5' 1.5" (1.562 m)   Wt 109 lb 3.2 oz (49.5 kg)   BMI 20.30 kg/m  Physical Exam Vitals and nursing note reviewed.  Constitutional:      General: She is not in acute distress.    Appearance: Normal appearance. She is well-developed and normal weight. She is not ill-appearing, toxic-appearing or diaphoretic.  HENT:     Head: Normocephalic and atraumatic.     Nose: No congestion or rhinorrhea.  Eyes:     General: No scleral icterus. Cardiovascular:     Rate and Rhythm: Normal rate and regular rhythm.     Heart sounds: Normal heart sounds.  Pulmonary:     Effort: Pulmonary effort is normal. No respiratory distress.     Breath sounds: Normal breath sounds.  Abdominal:     General: Bowel sounds are normal.     Palpations: Abdomen is soft. There is no hepatomegaly, splenomegaly or mass.     Tenderness: There is no abdominal tenderness. There is no guarding or rebound.     Hernia: No hernia is present.  Skin:    General: Skin is warm and dry.     Coloration: Skin is not jaundiced.     Findings: No rash.  Neurological:     General: No focal  deficit present.     Mental Status: She is alert and oriented to person, place, and time.  Psychiatric:        Attention and Perception: Attention normal.        Mood and Affect: Mood is anxious.        Behavior: Behavior normal.        Thought Content: Thought content normal.        Cognition and Memory: Cognition and memory normal.       06/04/2020 8:59 AM   Disclaimer: This note was dictated with voice recognition software. Similar sounding words can inadvertently be transcribed and may not be corrected upon review.

## 2020-06-04 NOTE — Assessment & Plan Note (Signed)
Notes GERD symptoms as per HPI including esophageal burning, reflux.  Is currently on Protonix twice daily.  Also with complaints of dysphagia as per below as well as weight loss.  Because of this we will plan for an upper endoscopy as per below.  Continue current PPI as she is only been on this for about 2 or 3 days.  Call for any worsening problems.  Follow-up in 4 months otherwise.

## 2020-06-04 NOTE — Assessment & Plan Note (Addendum)
The patient notes dysphagia symptoms and postprandial coughing.  Apparently has had some persistent symptomatic GERD over the past month, possibly silent GERD prior to this.  Cannot rule out stricture, narrowing, ring, less likely mass.  Likely GERD related dysphagia.  Recommended she continue her PPI.  We will plan for an upper endoscopy with possible dilation which will allow for evaluation of her dysphagia as well as evaluation of her GERD symptoms and weight loss.  Follow-up in 4 months.  Proceed with EGD +/- dilation on propofol/MAC with Dr. Gala Romney in near future: the risks, benefits, and alternatives have been discussed with the patient in detail. The patient states understanding and desires to proceed.  The patient is currently on Xanax.  She seems quite anxious.The patient is not on any other anticoagulants, anxiolytics, chronic pain medications, antidepressants, antidiabetics, or iron supplements.  We will plan for the procedure on MAC sedation to promote adequate sedation.  ASA II (current smoker)

## 2020-06-13 ENCOUNTER — Telehealth: Payer: Self-pay | Admitting: *Deleted

## 2020-06-13 ENCOUNTER — Telehealth: Payer: Self-pay | Admitting: Nurse Practitioner

## 2020-06-13 ENCOUNTER — Encounter: Payer: Self-pay | Admitting: Nurse Practitioner

## 2020-06-13 NOTE — Telephone Encounter (Signed)
Patient called back to also say that she is belching and there are sounds coming from her stomach

## 2020-06-13 NOTE — Telephone Encounter (Signed)
Patient called again to add gas and that she has to drink coffee to have a bowel movement and after that it is diarrhea

## 2020-06-13 NOTE — Telephone Encounter (Signed)
Fowarding to RMR nurse

## 2020-06-13 NOTE — Telephone Encounter (Signed)
Schedule TCS/EGD +/-dil with propofol with RMR  Called patient to schedule. She states after she eats/drink she coughs, voice gets hoarse, thick pieces of yellow-green mucus. She thinks it may be going down into her lungs. She is scheduled for procedure 07/11/2020 at 12:00pm. Patient aware will need covid test prior and will mail this with her instructions. Confirmed mailing address is correct.  Patient requesting further recs until procedure

## 2020-06-13 NOTE — Telephone Encounter (Signed)
Spoke with pt. Pt is concerned with the belching, gas, bloating and stool concerns. Pt isn't taking any otc gas medication. Pt is taking Pantoprazole 40 mg bid. Pt states she will cough and a thick mucous comes out and pt then becomes hoarse voice. Pt is taking Benefiber once daily as directed. Pt added coffee to her regimen to help her have a BM. The coffee works well for pt, but after the first BM, pt has 2-3 more loose stool. Pt doesn't want to have diarrhea after her first initial BM. Please advise.

## 2020-06-13 NOTE — Telephone Encounter (Signed)
Fowarding to RMR nurse 

## 2020-06-19 NOTE — Telephone Encounter (Signed)
Can we schedule her for a BPE? Tell her if she develops a fever, has worsening shortness of breath to call us or go to the ER

## 2020-06-19 NOTE — Telephone Encounter (Signed)
Lmom, waiting on a return call.  

## 2020-06-19 NOTE — Telephone Encounter (Signed)
See previous note for cough concerns after eating/drinking (requested BPE). If she is having general coughing with mucus/phlegm she should reach out to her PCP to check for bronchitis, other URI. If she's having loose stools after first BM, she can try holding the Benefiber (if she's going to have coffee). She could also take a half dose of benefiber. She may need to "play with" the benefiber dose with the coffee to try and find the dose that works with coffee...and without coffee.

## 2020-06-21 NOTE — Telephone Encounter (Signed)
LMOVM for pt to call back 

## 2020-06-26 NOTE — Telephone Encounter (Signed)
LMOVM

## 2020-06-27 ENCOUNTER — Other Ambulatory Visit: Payer: Self-pay

## 2020-06-27 ENCOUNTER — Encounter (HOSPITAL_COMMUNITY)
Admission: RE | Admit: 2020-06-27 | Discharge: 2020-06-27 | Disposition: A | Discharge status: Home / Self Care | Payer: Self-pay | Source: Ambulatory Visit | Attending: Internal Medicine | Admitting: Internal Medicine

## 2020-06-27 NOTE — OR Nursing (Signed)
Attempted to call patient for pre screening , unable to contact patient. Left message for her to return call.

## 2020-07-10 ENCOUNTER — Encounter (HOSPITAL_COMMUNITY): Payer: Self-pay | Admitting: Anesthesiology

## 2020-07-10 ENCOUNTER — Telehealth: Payer: Self-pay | Admitting: *Deleted

## 2020-07-10 ENCOUNTER — Other Ambulatory Visit (HOSPITAL_COMMUNITY)
Admission: RE | Admit: 2020-07-10 | Discharge: 2020-07-10 | Disposition: A | Discharge status: Home / Self Care | Payer: Self-pay | Source: Ambulatory Visit | Attending: Internal Medicine | Admitting: Internal Medicine

## 2020-07-10 NOTE — Telephone Encounter (Signed)
Patient called to cancel procedure scheduled for tomorrow at 12:00pm with Dr. Jena Gauss. She states she is "down in her back". Patient aware will call at a later time to get her rescheduled. Called endo and LMOVM to place in "depot"  Patient was scheduled for TCS/EGD/DIL with propofol with Dr. Jena Gauss.

## 2020-07-10 NOTE — Progress Notes (Signed)
Called patient and left her a message. Asking her to call me at [phone]. I also let her know she has until 4:30 to get here. Waiting for patient's call. Haven't heard from patient, I'll try calling her again. Spoke with patient, she has been down in her back, she did not start her prep Tuesday. Patient is going to call her Dr.'s office and let them know she needs to reschedule her procedure. Nothing further needed.

## 2020-07-16 ENCOUNTER — Telehealth: Payer: Self-pay | Admitting: *Deleted

## 2020-07-16 NOTE — Telephone Encounter (Signed)
LMOVM for pt schedule TCS/EGD/DIL with propofol with Dr. Jena Gauss

## 2020-07-16 NOTE — Telephone Encounter (Signed)
Patient called back. She has been rescheduled to 9/2 at 10:30am. Patient aware will need covid test prior. Advised will mail prep instructions with her covid test. Called endo and made aware of appt change.

## 2020-07-25 ENCOUNTER — Encounter (HOSPITAL_COMMUNITY)
Admission: RE | Admit: 2020-07-25 | Discharge: 2020-07-25 | Disposition: A | Discharge status: Home / Self Care | Payer: Self-pay | Source: Ambulatory Visit | Attending: Internal Medicine | Admitting: Internal Medicine

## 2020-07-25 ENCOUNTER — Other Ambulatory Visit: Payer: Self-pay

## 2020-08-06 ENCOUNTER — Encounter (HOSPITAL_COMMUNITY): Payer: Self-pay | Admitting: Internal Medicine

## 2020-08-06 ENCOUNTER — Other Ambulatory Visit: Payer: Self-pay

## 2020-08-06 ENCOUNTER — Telehealth: Payer: Self-pay | Admitting: Nurse Practitioner

## 2020-08-06 NOTE — Telephone Encounter (Signed)
Patient called wanting to make sure she was having an upper and lower at the time of her procedure

## 2020-08-06 NOTE — Telephone Encounter (Signed)
Called pt, informed her she will be having both procedures.

## 2020-08-07 ENCOUNTER — Other Ambulatory Visit (HOSPITAL_COMMUNITY)
Admission: RE | Admit: 2020-08-07 | Discharge: 2020-08-07 | Disposition: A | Discharge status: Home / Self Care | Payer: Self-pay | Source: Ambulatory Visit | Attending: Internal Medicine | Admitting: Internal Medicine

## 2020-08-07 ENCOUNTER — Telehealth: Payer: Self-pay

## 2020-08-07 NOTE — Telephone Encounter (Signed)
Melanie at endo called office, pt was no show for covid test this morning. Nelia Shi tried to call pt, no answer.  Tried to call pt, no answer, LMOVM for return call.

## 2020-08-07 NOTE — Telephone Encounter (Signed)
Melanie called office and LMOVM to cancel procedure for tomorrow d/t no show for covid test and unable to reach pt.  FYI to Wynne Dust NP.

## 2020-08-07 NOTE — Progress Notes (Signed)
Called patient and left a message with her voicemail. That she would need to come in today for her covid test. If she couldn't make it in she would need to call her Dr's office and reschedule.Patient hasn't called yet. Melanie called and asked me if the patient showed up, "No, ma'am." She said she'd call the dr's office. I called the patient back and stressed to her how important it is for her to call me back when she gets this message. Waiting. Didn't here from patient or dr.'s office. Nothing further needed.

## 2020-08-08 SURGERY — COLONOSCOPY WITH PROPOFOL
Anesthesia: Monitor Anesthesia Care

## 2020-08-08 NOTE — Telephone Encounter (Signed)
Noted  

## 2020-08-14 ENCOUNTER — Ambulatory Visit (HOSPITAL_COMMUNITY): Admission: RE | Admit: 2020-08-14 | Payer: Self-pay | Source: Home / Self Care | Admitting: Internal Medicine

## 2020-10-02 IMAGING — CT CT ABD-PELV W/ CM
2 of 5 series · 17 of 46 positions shown, 19 images · IV contrast (Omnipaque or Isovue)
Comparison: 02/17/2017

CLINICAL DATA: Left lower quadrant pain for 1 week

EXAM:
CT ABDOMEN AND PELVIS WITH CONTRAST
TECHNIQUE: Multidetector CT imaging of the abdomen and pelvis was performed
using the standard protocol following bolus administration of
intravenous contrast.
CONTRAST:  75mL OMNIPAQUE IOHEXOL 300 MG/ML  SOLN

[Series 2: axial st · axial · 0.67mm/px · z∈[-542,-197]mm · 14 of 79 slices shown, 16 images]
[im 5/79  soft-tissue]
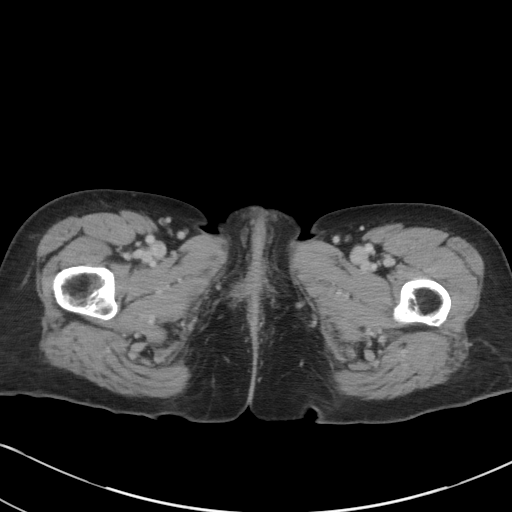
[im 5/79  bone]
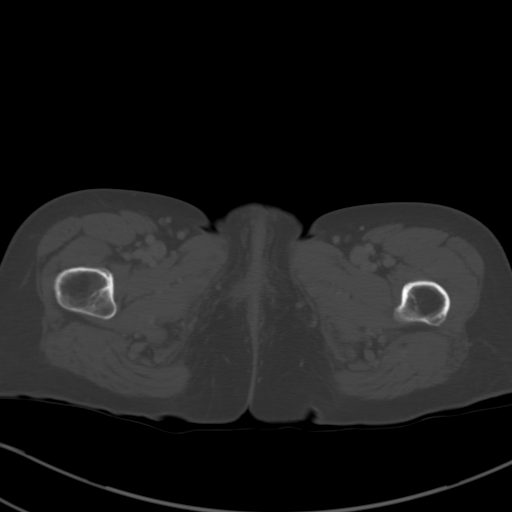
[im 9/79  soft-tissue]
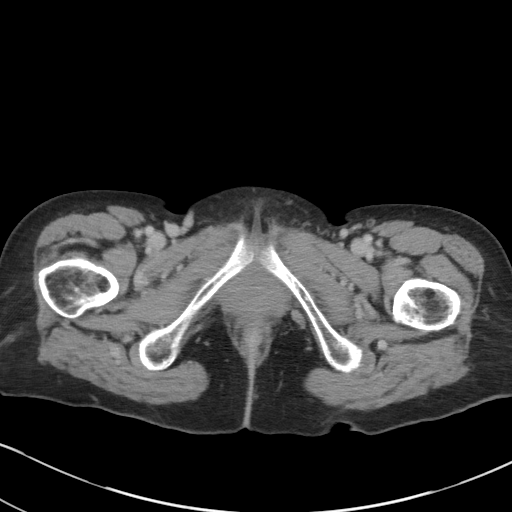
[im 18/79  soft-tissue]
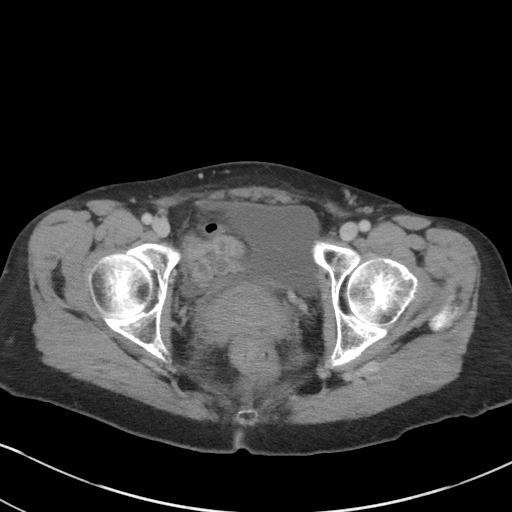
[im 22/79  soft-tissue]
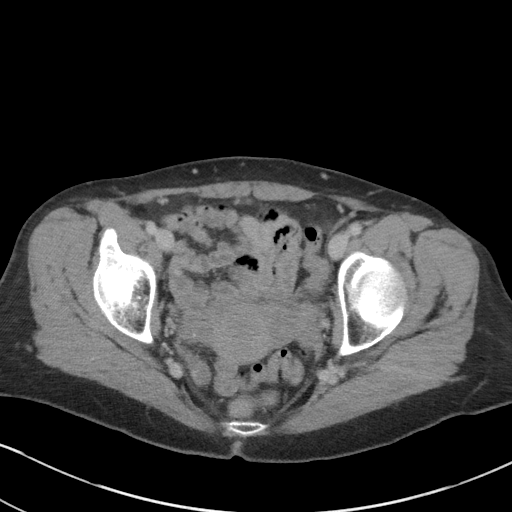
[im 27/79  soft-tissue]
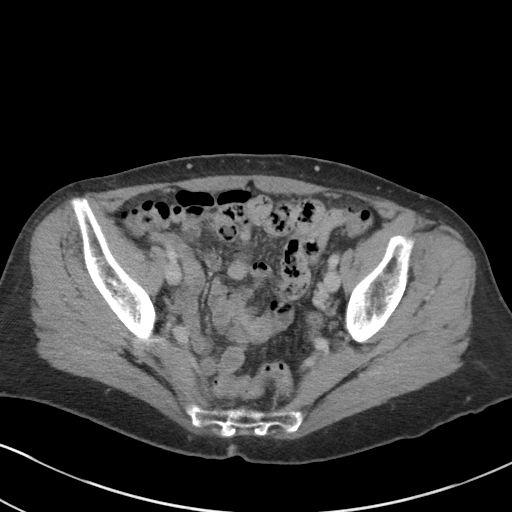
[im 31/79  soft-tissue]
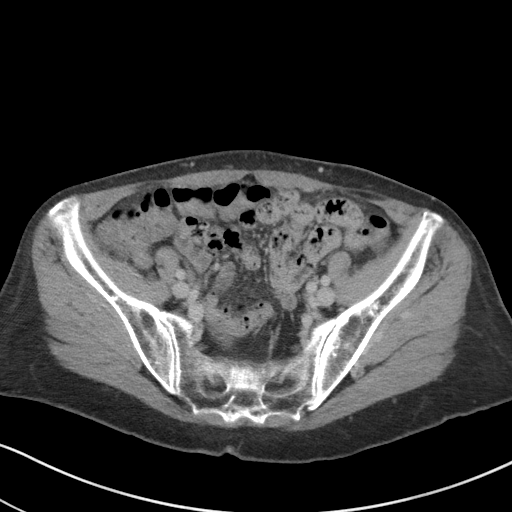
[im 35/79  soft-tissue]
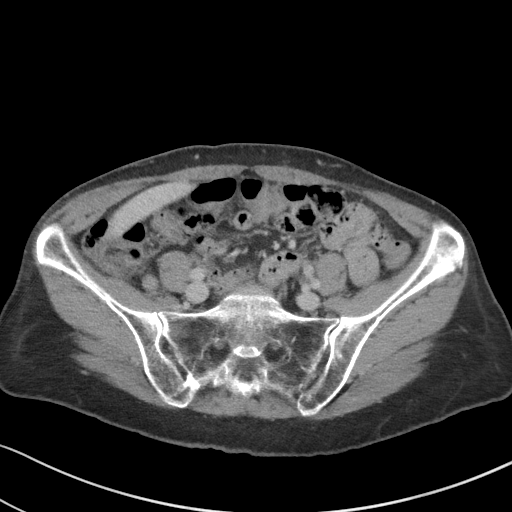
[im 44/79  soft-tissue]
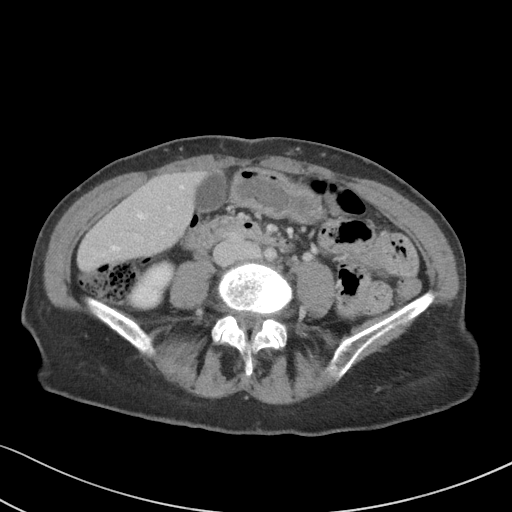
[im 48/79  soft-tissue]
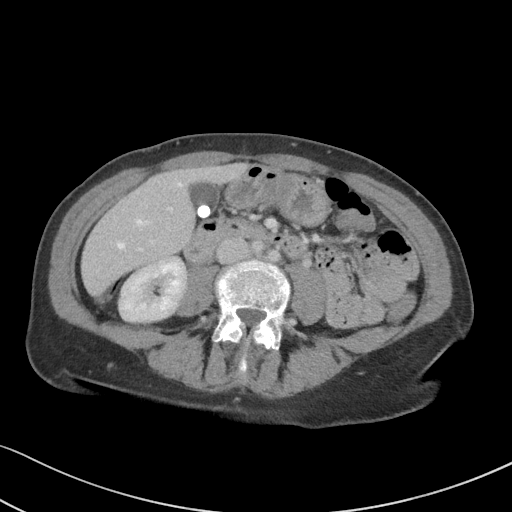
[im 48/79  bone]
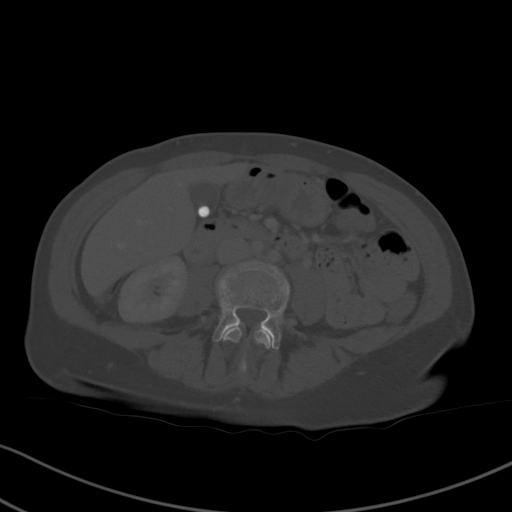
[im 53/79  soft-tissue]
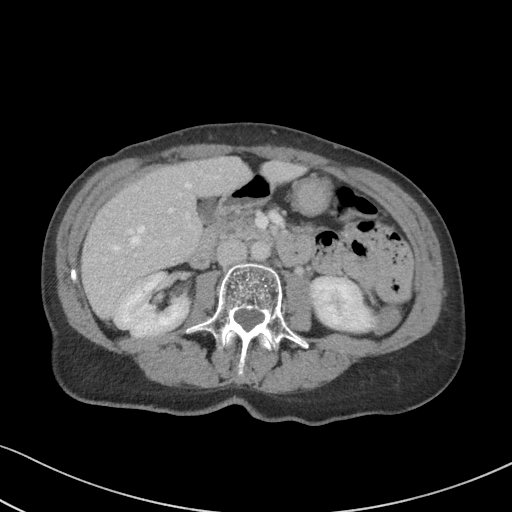
[im 57/79  soft-tissue]
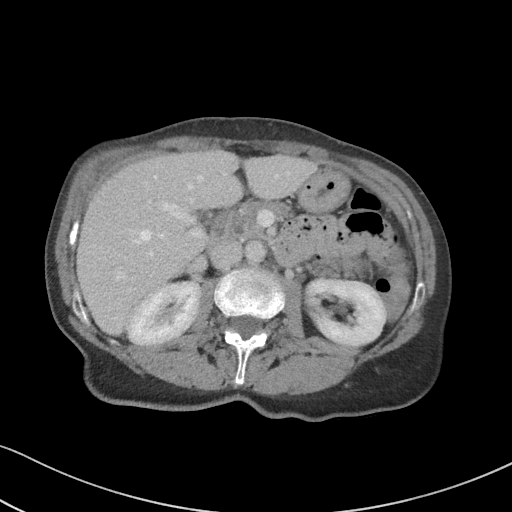
[im 61/79  soft-tissue]
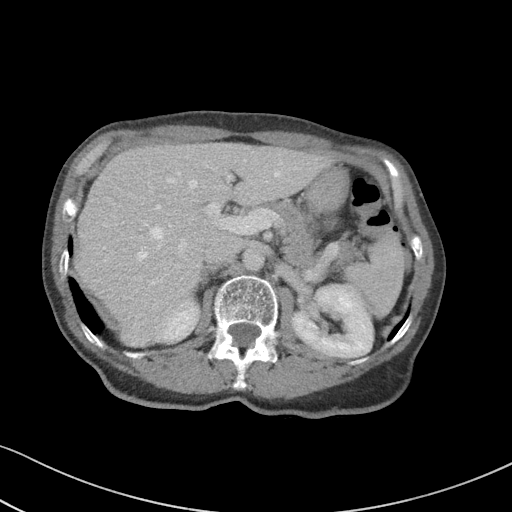
[im 70/79  soft-tissue]
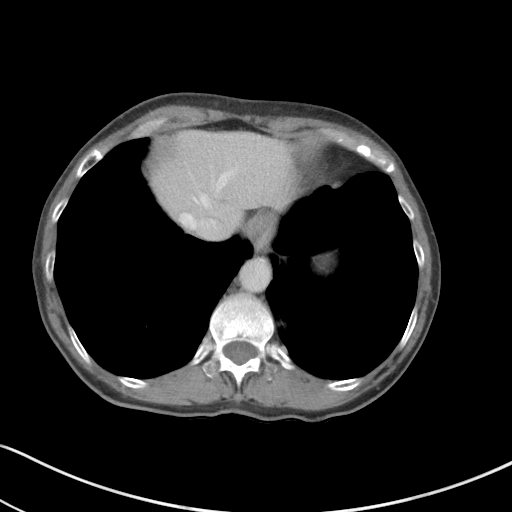
[im 74/79  soft-tissue]
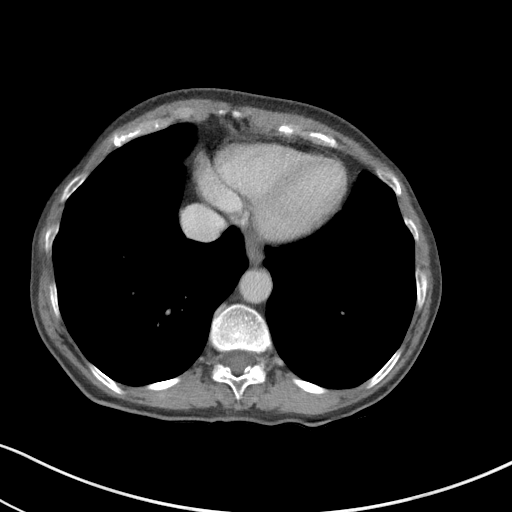

[Series 6: coronal st · coronal · 0.68mm/px · 3 of 95 slices shown]
[im 32/95  soft-tissue]
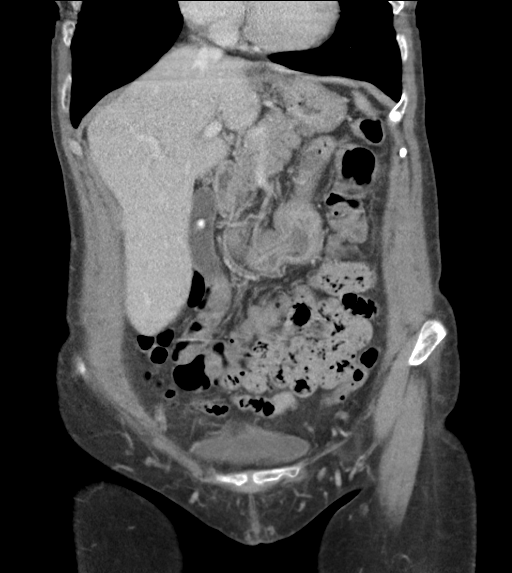
[im 42/95  soft-tissue]
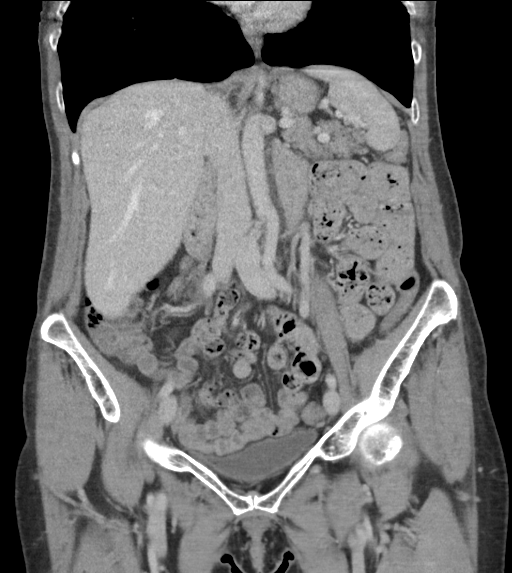
[im 53/95  soft-tissue]
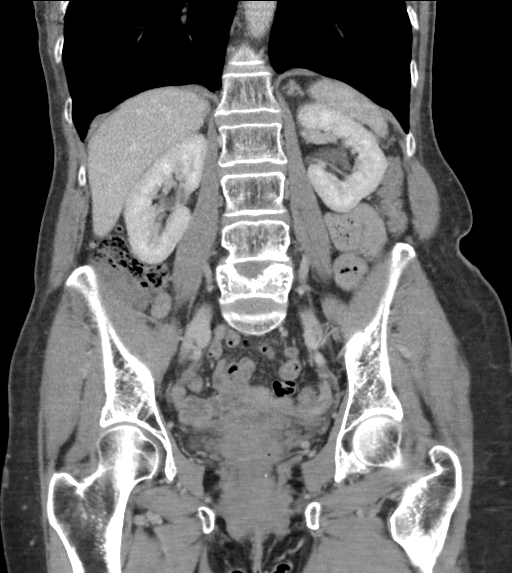

[17 of 46 positions shown; findings below may reference images not displayed]

FINDINGS: Lower chest: No acute abnormality.

Hepatobiliary: Cholelithiasis is identified without complicating
factors. Liver is within normal limits.

Pancreas: Unremarkable. No pancreatic ductal dilatation or
surrounding inflammatory changes.

Spleen: Normal in size without focal abnormality.

Adrenals/Urinary Tract: Adrenal glands are unremarkable. Kidneys are
normal, without renal calculi, focal lesion, or hydronephrosis.
Bladder is unremarkable.

Stomach/Bowel: The appendix is well visualized and air filled. No
obstructive or inflammatory changes of the large or small bowel are
seen. Sliding-type hiatal hernia is noted.

Vascular/Lymphatic: No significant vascular findings are present. No
enlarged abdominal or pelvic lymph nodes.

Reproductive: Uterus and bilateral adnexa are unremarkable.

Other: No abdominal wall hernia or abnormality. No abdominopelvic
ascites.

Musculoskeletal: No acute or significant osseous findings.
IMPRESSION: Cholelithiasis without complicating factors. No other focal
abnormality is noted.

## 2020-10-07 DEATH — deceased

## 2020-10-08 ENCOUNTER — Encounter: Payer: Self-pay | Admitting: Internal Medicine

## 2020-10-08 ENCOUNTER — Ambulatory Visit: Payer: Self-pay | Admitting: Nurse Practitioner

## 2020-10-08 NOTE — Progress Notes (Deleted)
Referring Provider: Elfredia Nevins, MD Primary Care Physician:  Elfredia Nevins, MD Primary GI:  Dr. Jena Gauss  No chief complaint on file.   HPI:   Vickie [name] is a 59 y.o. female who presents for follow-up on GERD and dysphagia. The patient was last seen in our office 06/04/2020 for the same as well as weight loss and diarrhea.  Previously in the emergency department in June 2021 noting left lower quadrant abdominal pain, nausea, tenesmus and diarrhea associated with loose stools without blood.  Also noted dysphagia and odynophagia around the time of discharge.  Labs and urinalysis were normal.  CT of the abdomen and pelvis essentially unremarkable.  At her last visit noted pantoprazole 40 mg twice a day which is helping, no previous GI complaints.  Diarrhea improved and now with constipation.  Did have loose stools after drinking a cup of coffee.  Not taking any medications but trying to eat more fiber.  No previous colonoscopy.  Noted 11 pound weight loss in the past 2 months, no other GI complaints.  Recommended continue Protonix 40 mg twice daily, drink plenty of water, eat high-fiber foods and/or fiber supplement, colonoscopy, endoscopy, follow-up in 4 months.  Endoscopic procedure was initially scheduled for 07/11/2020.  Due to persistent complaints she is scheduled for BPE.  It does not appear this was completed.  She called our office with complaints of loose stools after initial bowel movement when drinking coffee to help with constipation.  Recommended she reduce her fiber supplement to half or skip it altogether if she is going to drink coffee.  Again recommended barium pill esophagram.  She called our office 07/10/2020 to cancel her endoscopic evaluation.  She was rescheduled for 08/08/2020.  However, she was a no-show for her COVID-19 testing and her procedure was subsequently canceled.  Today she states she doing okay overall.  Past Medical History:  Diagnosis Date  . Anxiety    . Depression     No past surgical history on file.  Current Outpatient Medications  Medication Sig Dispense Refill  . ALPRAZolam (XANAX) 1 MG tablet Take 1 mg by mouth 4 (four) times daily as needed for anxiety.     . Biotin (BIOTIN 5000) 5 MG CAPS Take 5 mg by mouth daily.     . cholecalciferol (VITAMIN D) 25 MCG (1000 UNIT) tablet Take 1,000 Units by mouth daily.    . Multiple Vitamin (MULTIVITAMIN WITH MINERALS) TABS tablet Take 1 tablet by mouth daily.    . Omega-3 Fatty Acids (FISH OIL) 1000 MG CAPS Take 1,000 mg by mouth daily.    . pantoprazole (PROTONIX) 40 MG tablet Take 40 mg by mouth 2 (two) times daily.      No current facility-administered medications for this visit.    Allergies as of 10/08/2020 - Review Complete 08/06/2020  Allergen Reaction Noted  . Codeine Rash 08/11/2011  . Penicillins Rash 08/11/2011    Family History  Problem Relation Age of Onset  . Heart disease Father   . Arthritis Mother   . Heart disease Mother   . Stroke Mother   . Heart disease Brother   . Heart disease Brother   . Colon cancer Neg Hx     Social History   Socioeconomic History  . Marital status: Married    Spouse name: Not on file  . Number of children: Not on file  . Years of education: Not on file  . Highest education level: Not on file  Occupational History  . Not on file  Tobacco Use  . Smoking status: Current Every Day Smoker    Packs/day: 1.00    Types: Cigarettes  . Smokeless tobacco: Never Used  Vaping Use  . Vaping Use: Never used  Substance and Sexual Activity  . Alcohol use: Not Currently    Comment: occ  . Drug use: No  . Sexual activity: Not Currently    Birth control/protection: Post-menopausal  Other Topics Concern  . Not on file  Social History Narrative  . Not on file   Social Determinants of Health   Financial Resource Strain: Low Risk   . Difficulty of Paying Living Expenses: Not hard at all  Food Insecurity: No Food Insecurity  .  Worried About Programme researcher, broadcasting/film/video in the Last Year: Never true  . Ran Out of Food in the Last Year: Never true  Transportation Needs: No Transportation Needs  . Lack of Transportation (Medical): No  . Lack of Transportation (Non-Medical): No  Physical Activity: Sufficiently Active  . Days of Exercise per Week: 5 days  . Minutes of Exercise per Session: 30 min  Stress:   . Feeling of Stress : Not on file  Social Connections: Moderately Isolated  . Frequency of Communication with Friends and Family: More than three times a week  . Frequency of Social Gatherings with Friends and Family: Three times a week  . Attends Religious Services: Never  . Active Member of Clubs or Organizations: No  . Attends Banker Meetings: Never  . Marital Status: Living with partner    Subjective:*** Review of Systems  Constitutional: Negative for chills, fever, malaise/fatigue and weight loss.  HENT: Negative for congestion and sore throat.   Respiratory: Negative for cough and shortness of breath.   Cardiovascular: Negative for chest pain and palpitations.  Gastrointestinal: Negative for abdominal pain, blood in stool, diarrhea, melena, nausea and vomiting.  Musculoskeletal: Negative for joint pain and myalgias.  Skin: Negative for rash.  Neurological: Negative for dizziness and weakness.  Endo/Heme/Allergies: Does not bruise/bleed easily.  Psychiatric/Behavioral: Negative for depression. The patient is not nervous/anxious.   All other systems reviewed and are negative.    Objective: There were no vitals taken for this visit. Physical Exam Vitals and nursing note reviewed.  Constitutional:      General: She is not in acute distress.    Appearance: Normal appearance. She is well-developed. She is not ill-appearing, toxic-appearing or diaphoretic.  HENT:     Head: Normocephalic and atraumatic.     Nose: No congestion or rhinorrhea.  Eyes:     General: No scleral  icterus. Cardiovascular:     Rate and Rhythm: Normal rate and regular rhythm.     Heart sounds: Normal heart sounds.  Pulmonary:     Effort: Pulmonary effort is normal. No respiratory distress.     Breath sounds: Normal breath sounds.  Abdominal:     General: Bowel sounds are normal.     Palpations: Abdomen is soft. There is no hepatomegaly, splenomegaly or mass.     Tenderness: There is no abdominal tenderness. There is no guarding or rebound.     Hernia: No hernia is present.  Skin:    General: Skin is warm and dry.     Coloration: Skin is not jaundiced.     Findings: No rash.  Neurological:     General: No focal deficit present.     Mental Status: She is alert and oriented to person,  place, and time.  Psychiatric:        Attention and Perception: Attention normal.        Mood and Affect: Mood normal.        Speech: Speech normal.        Behavior: Behavior normal.        Thought Content: Thought content normal.        Cognition and Memory: Cognition and memory normal.      Assessment:  ***   Plan: ***    Thank you for allowing [place] to participate in the care of [facility]  Wynne Dust, DNP, AGNP-C Adult & Gerontological Nurse Practitioner [facility]   10/08/2020 8:28 AM   Disclaimer: This note was dictated with voice recognition software. Similar sounding words can inadvertently be transcribed and may not be corrected upon review.
